# Patient Record
Sex: Female | Born: 1985 | Race: White | Hispanic: No | Marital: Single | Smoking: Never smoker
Health system: Southern US, Community
[De-identification: ages and names within clinical notes are randomized; demographics above are authoritative.]

## PROBLEM LIST (undated history)

## (undated) DIAGNOSIS — N750 Cyst of Bartholin's gland: Secondary | ICD-10-CM

## (undated) HISTORY — PX: OTHER SURGICAL HISTORY: SHX169

---

## 2005-07-31 ENCOUNTER — Ambulatory Visit: Payer: Self-pay | Admitting: Sports Medicine

## 2005-07-31 ENCOUNTER — Inpatient Hospital Stay (HOSPITAL_COMMUNITY): Admission: EM | Admit: 2005-07-31 | Discharge: 2005-08-02 | Payer: Self-pay | Admitting: Emergency Medicine

## 2006-06-19 ENCOUNTER — Other Ambulatory Visit: Admission: RE | Admit: 2006-06-19 | Discharge: 2006-06-19 | Payer: Self-pay | Admitting: Unknown Physician Specialty

## 2006-06-19 ENCOUNTER — Encounter (INDEPENDENT_AMBULATORY_CARE_PROVIDER_SITE_OTHER): Payer: Self-pay | Admitting: Specialist

## 2008-12-16 ENCOUNTER — Emergency Department (HOSPITAL_COMMUNITY): Admission: EM | Admit: 2008-12-16 | Discharge: 2008-12-16 | Payer: Self-pay | Admitting: Emergency Medicine

## 2009-01-05 ENCOUNTER — Other Ambulatory Visit: Admission: RE | Admit: 2009-01-05 | Discharge: 2009-01-05 | Payer: Self-pay | Admitting: Obstetrics & Gynecology

## 2009-02-26 ENCOUNTER — Other Ambulatory Visit: Payer: Self-pay | Admitting: Emergency Medicine

## 2009-02-27 ENCOUNTER — Inpatient Hospital Stay (HOSPITAL_COMMUNITY): Admission: AD | Admit: 2009-02-27 | Discharge: 2009-03-06 | Payer: Self-pay | Admitting: Obstetrics & Gynecology

## 2009-02-27 ENCOUNTER — Ambulatory Visit: Payer: Self-pay | Admitting: Internal Medicine

## 2009-02-27 ENCOUNTER — Ambulatory Visit: Payer: Self-pay | Admitting: Obstetrics & Gynecology

## 2009-03-03 ENCOUNTER — Ambulatory Visit: Payer: Self-pay | Admitting: Emergency Medicine

## 2009-03-04 ENCOUNTER — Encounter: Payer: Self-pay | Admitting: Emergency Medicine

## 2009-03-04 ENCOUNTER — Encounter: Payer: Self-pay | Admitting: Obstetrics & Gynecology

## 2009-06-01 ENCOUNTER — Inpatient Hospital Stay (HOSPITAL_COMMUNITY): Admission: AD | Admit: 2009-06-01 | Discharge: 2009-06-02 | Payer: Self-pay | Admitting: Obstetrics & Gynecology

## 2009-06-01 ENCOUNTER — Ambulatory Visit: Payer: Self-pay | Admitting: Family

## 2009-06-27 ENCOUNTER — Inpatient Hospital Stay (HOSPITAL_COMMUNITY): Admission: AD | Admit: 2009-06-27 | Discharge: 2009-06-29 | Payer: Self-pay | Admitting: Obstetrics & Gynecology

## 2010-11-01 ENCOUNTER — Emergency Department (HOSPITAL_COMMUNITY)
Admission: EM | Admit: 2010-11-01 | Discharge: 2010-11-02 | Disposition: A | Discharge status: Home / Self Care | Payer: Self-pay | Attending: Emergency Medicine | Admitting: Emergency Medicine

## 2010-11-01 DIAGNOSIS — B9689 Other specified bacterial agents as the cause of diseases classified elsewhere: Secondary | ICD-10-CM | POA: Insufficient documentation

## 2010-11-01 DIAGNOSIS — N751 Abscess of Bartholin's gland: Secondary | ICD-10-CM | POA: Insufficient documentation

## 2010-11-01 DIAGNOSIS — A499 Bacterial infection, unspecified: Secondary | ICD-10-CM | POA: Insufficient documentation

## 2010-11-01 DIAGNOSIS — N760 Acute vaginitis: Secondary | ICD-10-CM | POA: Insufficient documentation

## 2010-11-01 LAB — WET PREP, GENITAL

## 2010-11-03 LAB — GC/CHLAMYDIA PROBE AMP, GENITAL: Chlamydia, DNA Probe: NEGATIVE

## 2010-11-05 LAB — CULTURE, ROUTINE-ABSCESS: Culture: NO GROWTH

## 2010-11-23 LAB — CBC
Hemoglobin: 11.2 g/dL — ABNORMAL LOW (ref 12.0–15.0)
MCHC: 33.5 g/dL (ref 30.0–36.0)
MCV: 87.3 fL (ref 78.0–100.0)
Platelets: 172 10*3/uL (ref 150–400)
RDW: 12 % (ref 11.5–15.5)
WBC: 13.5 10*3/uL — ABNORMAL HIGH (ref 4.0–10.5)

## 2010-11-27 LAB — CULTURE, BLOOD (ROUTINE X 2): Culture: NO GROWTH

## 2010-11-27 LAB — COMPREHENSIVE METABOLIC PANEL
ALT: 50 U/L — ABNORMAL HIGH (ref 0–35)
Albumin: 2 g/dL — ABNORMAL LOW (ref 3.5–5.2)
Alkaline Phosphatase: 227 U/L — ABNORMAL HIGH (ref 39–117)
BUN: 2 mg/dL — ABNORMAL LOW (ref 6–23)
BUN: 7 mg/dL (ref 6–23)
Calcium: 7.9 mg/dL — ABNORMAL LOW (ref 8.4–10.5)
GFR calc Af Amer: >60 mL/min (ref >60)
Glucose, Bld: 121 mg/dL — ABNORMAL HIGH (ref 70–99)
Glucose, Bld: 121 mg/dL — ABNORMAL HIGH (ref 70–99)
Potassium: 3 mEq/L — ABNORMAL LOW (ref 3.5–5.1)
Sodium: 133 mEq/L — ABNORMAL LOW (ref 135–145)
Sodium: 135 mEq/L (ref 135–145)
Total Bilirubin: 0.7 mg/dL (ref 0.3–1.2)
Total Protein: 4.7 g/dL — ABNORMAL LOW (ref 6.0–8.3)

## 2010-11-27 LAB — DIFFERENTIAL
Basophils Absolute: 0 10*3/uL (ref 0.0–0.1)
Eosinophils Absolute: 0 10*3/uL (ref 0.0–0.7)
Eosinophils Relative: 0 % (ref 0–5)
Lymphocytes Relative: 2 % — ABNORMAL LOW (ref 12–46)
Lymphocytes Relative: 2 % — ABNORMAL LOW (ref 12–46)
Lymphs Abs: 0.1 10*3/uL — ABNORMAL LOW (ref 0.7–4.0)
Monocytes Relative: 2 % — ABNORMAL LOW (ref 3–12)
Monocytes Relative: 3 % (ref 3–12)
Neutrophils Relative %: 95 % — ABNORMAL HIGH (ref 43–77)

## 2010-11-27 LAB — URINALYSIS, MICROSCOPIC ONLY
Bilirubin Urine: NEGATIVE
Ketones, ur: NEGATIVE mg/dL
Nitrite: NEGATIVE
Nitrite: NEGATIVE
Protein, ur: NEGATIVE mg/dL
Specific Gravity, Urine: 1.025 (ref 1.005–1.030)
Urobilinogen, UA: 0.2 mg/dL (ref 0.0–1.0)
Urobilinogen, UA: 2 mg/dL — ABNORMAL HIGH (ref 0.0–1.0)
pH: 5.5 (ref 5.0–8.0)

## 2010-11-27 LAB — URINE CULTURE: Culture: NO GROWTH

## 2010-11-27 LAB — CBC
HCT: 27.9 % — ABNORMAL LOW (ref 36.0–46.0)
HCT: 31 % — ABNORMAL LOW (ref 36.0–46.0)
HCT: 35.5 % — ABNORMAL LOW (ref 36.0–46.0)
Hemoglobin: 10.7 g/dL — ABNORMAL LOW (ref 12.0–15.0)
Hemoglobin: 9.6 g/dL — ABNORMAL LOW (ref 12.0–15.0)
Hemoglobin: 9.8 g/dL — ABNORMAL LOW (ref 12.0–15.0)
MCHC: 34.7 g/dL (ref 30.0–36.0)
MCHC: 34.8 g/dL (ref 30.0–36.0)
MCV: 93.4 fL (ref 78.0–100.0)
MCV: 93.8 fL (ref 78.0–100.0)
Platelets: 103 10*3/uL — ABNORMAL LOW (ref 150–400)
Platelets: 104 10*3/uL — ABNORMAL LOW (ref 150–400)
Platelets: 106 10*3/uL — ABNORMAL LOW (ref 150–400)
Platelets: 159 10*3/uL (ref 150–400)
Platelets: 176 10*3/uL (ref 150–400)
RBC: 3 MIL/uL — ABNORMAL LOW (ref 3.87–5.11)
RBC: 3.11 MIL/uL — ABNORMAL LOW (ref 3.87–5.11)
RBC: 3.32 MIL/uL — ABNORMAL LOW (ref 3.87–5.11)
RDW: 14.7 % (ref 11.5–15.5)
RDW: 14.8 % (ref 11.5–15.5)
WBC: 22.6 10*3/uL — ABNORMAL HIGH (ref 4.0–10.5)
WBC: 4.1 10*3/uL (ref 4.0–10.5)
WBC: 6.3 10*3/uL (ref 4.0–10.5)
WBC: 6.9 10*3/uL (ref 4.0–10.5)
WBC: 7.6 10*3/uL (ref 4.0–10.5)

## 2010-11-27 LAB — GENTAMICIN LEVEL, PEAK: Gentamicin Pk: 4.4 ug/mL — ABNORMAL LOW (ref 5.0–10.0)

## 2010-11-27 LAB — URINE MICROSCOPIC-ADD ON

## 2010-11-27 LAB — BASIC METABOLIC PANEL
BUN: 8 mg/dL (ref 6–23)
CO2: 18 mEq/L — ABNORMAL LOW (ref 19–32)
Calcium: 7.5 mg/dL — ABNORMAL LOW (ref 8.4–10.5)
Calcium: 8.2 mg/dL — ABNORMAL LOW (ref 8.4–10.5)
Chloride: 111 mEq/L (ref 96–112)
Creatinine, Ser: 0.82 mg/dL (ref 0.4–1.2)
GFR calc Af Amer: >60 mL/min (ref >60)
GFR calc Af Amer: >60 mL/min (ref >60)
GFR calc non Af Amer: >60 mL/min (ref >60)
Sodium: 134 mEq/L — ABNORMAL LOW (ref 135–145)

## 2010-11-27 LAB — URINALYSIS, ROUTINE W REFLEX MICROSCOPIC
Nitrite: POSITIVE — AB
Specific Gravity, Urine: 1.022 (ref 1.005–1.030)
pH: 5.5 (ref 5.0–8.0)

## 2010-11-27 LAB — PROTIME-INR: Prothrombin Time: 13.8 seconds (ref 11.6–15.2)

## 2010-11-27 LAB — BRAIN NATRIURETIC PEPTIDE: Pro B Natriuretic peptide (BNP): 90 pg/mL (ref 0.0–100.0)

## 2010-11-27 LAB — GENTAMICIN LEVEL, TROUGH: Gentamicin Trough: 1.8 ug/mL (ref 0.5–2.0)

## 2010-11-27 LAB — STREP PNEUMONIAE URINARY ANTIGEN: Strep Pneumo Urinary Antigen: NEGATIVE

## 2010-11-27 LAB — LEGIONELLA ANTIGEN, URINE

## 2010-11-27 LAB — APTT: aPTT: 38 seconds — ABNORMAL HIGH (ref 24–37)

## 2010-11-30 LAB — DIFFERENTIAL
Basophils Absolute: 0 10*3/uL (ref 0.0–0.1)
Basophils Relative: 0 % (ref 0–1)
Eosinophils Absolute: 0 10*3/uL (ref 0.0–0.7)
Eosinophils Relative: 0 % (ref 0–5)
Lymphocytes Relative: 1 % — ABNORMAL LOW (ref 12–46)
Monocytes Absolute: 0.1 10*3/uL (ref 0.1–1.0)

## 2010-11-30 LAB — CBC
HCT: 42.5 % (ref 36.0–46.0)
MCV: 88.8 fL (ref 78.0–100.0)
RBC: 4.78 MIL/uL (ref 3.87–5.11)
WBC: 21.1 10*3/uL — ABNORMAL HIGH (ref 4.0–10.5)

## 2010-11-30 LAB — URINALYSIS, ROUTINE W REFLEX MICROSCOPIC
Ketones, ur: 15 mg/dL — AB
Leukocytes, UA: NEGATIVE
Nitrite: NEGATIVE
Protein, ur: 30 mg/dL — AB
Urobilinogen, UA: 0.2 mg/dL (ref 0.0–1.0)
pH: 5.5 (ref 5.0–8.0)

## 2010-11-30 LAB — COMPREHENSIVE METABOLIC PANEL
AST: 24 U/L (ref 0–37)
Albumin: 3.9 g/dL (ref 3.5–5.2)
Alkaline Phosphatase: 56 U/L (ref 39–117)
BUN: 13 mg/dL (ref 6–23)
CO2: 21 mEq/L (ref 19–32)
Chloride: 106 mEq/L (ref 96–112)
Creatinine, Ser: 0.6 mg/dL (ref 0.4–1.2)
GFR calc Af Amer: >60 mL/min (ref >60)
GFR calc non Af Amer: >60 mL/min (ref >60)
Potassium: 3.9 mEq/L (ref 3.5–5.1)
Total Bilirubin: 0.6 mg/dL (ref 0.3–1.2)

## 2010-11-30 LAB — URINE MICROSCOPIC-ADD ON

## 2011-01-03 NOTE — Discharge Summary (Signed)
[name], [name]               ACCOUNT NO.:  [id number]   MEDICAL RECORD NO.:  [id number]          PATIENT TYPE:  INP   LOCATION:  9159                          FACILITY:  [facility]   PHYSICIAN:  Tilda Burrow, M.D. DATE OF BIRTH:  03/15/1986   DATE OF ADMISSION:  02/27/2009  DATE OF DISCHARGE:  03/06/2009                               DISCHARGE SUMMARY   ADMITTING DIAGNOSES:  1. Pregnancy at 23 weeks 3 days.  2. Probable right pyelonephritis.   DISCHARGE DIAGNOSES:  1. Pregnancy at 24 weeks 6 days, not delivered.  2. Right pyelonephritis.  3. Urosepsis, multiple species, specific organism not identified.  4. Acute lung injury secondary to urosepsis.  5. Hypokalemia, corrected.  6. Bilateral pleural effusions, resolved.   DISCHARGE MEDICATIONS:  1. Prenatal vitamins 1 p.o. daily.  2. Iron sulfate 325 mg p.o. twice daily x90 days.  3. Keflex 500 mg p.o. q.i.d. x7 days.  4. Macrodantin 100 mg p.o. at bedtime after 7 days until delivered.   HOSPITAL SUMMARY:  This is a 25 year old female gravida 1, para 0-0-0-0  at 23 weeks 3 days, was admitted on February 27, 2009, after presenting  complaining of right back pain associated with fever, severe  leukocytosis.  White count 22,000, hemoglobin 12, hematocrit 35, and  platelets 159,000.  Urinalysis showing hemoglobin, 4+ ketones, large  leukocytes, large nitrites with many white cells with urine C and S  obtained, which subsequently grew multiple species.  Prenatal course has  been followed through [facility] with initial ultrasound in the  first trimester at 12 weeks 6 days, confirming gestational age.   PRENATAL LABS:  Blood type O positive.  Antibody screen negative.  Hemoglobin 12 and platelets 220,000.  Rubella immune.  Hepatitis B  surface antigen negative.  RPR nonreactive.  HIV nonreactive.  GC  negative.  Chlamydia negative.   HOSPITAL COURSE:  The patient was admitted with pallor with temperature  of 103.4 on the  date of admission, and due to the severity of her  initial presentation and staffing considerations transferred to the  adult ICU.  Initial creatinine clearance was 106.4, estimated.  She was  initially began on Rocephin, but was switched ampicillin 2 g IV q.6 h.,  gentamycin as per pharmacy, clindamycin 900 mg q.8 h. with ibuprofen for  fever.  She was transferred to the AICU, at that time on the evening of  admission.  Blood cultures were obtained which were subsequently  negative.  She had significant pain on first 2 days.  She had persistent  temperature spikes through March 01, 2009.  At this time, she had  significant drop in her white count to a low at 4100 on March 01, 2009.  Hemoglobin dropped to 9.7, hematocrit 27.9, attributed to dilutional  effect as this is more consistent with her preadmission mild anemia.  She developed mild bibasilar rales on hospital day 3, March 01, 2009.  Urine culture returned greater than 10-5 multiple species.  Blood  cultures negative.  Chest x-ray due to development of her cough and  bibasilar rales showed bilateral perihilar  opacities suggest to  represent pulmonary edema or pneumonia.  On March 02, 2009, the patient  had temperature spike to 102.7 and had persistence of low back and right  flank pain.  Renal ultrasound showed no evidence of ureteral  obstruction.  The ICU consultation was performed and the patient had  Zosyn added to antibiotic coverage.  She received Lasix IV for diuresis.   On March 03, 2009, her general appearance was improving with good  diuresis.  Chest x-ray on March 03, 2009 showed diffuse bilateral  interstitial infiltrates.  Laboratory evaluation showed mild hypokalemia  with potassium at 3.0.  She was continued on therapy with a presumed  pyelonephritis.  The bilateral pulmonary infiltrates are considered  consistent with acute lung injury secondary to pyelonephritis.  Trans-  tracheoesophageal studies were performed to rule  out cardiomyopathy.   On March 04, 2009, the patient continued to improve with improved  breathing and reduced pain.  On March 04, 2009, chest x-ray showed  resolved pleural effusion with no pulmonary edema appreciable.  Maternal  fetal consult was obtained.  MRI was obtained to rule out appendicitis  or urinary system abnormality, this was negative.  The cardiac echo was  similarly negative.  On March 04, 2009, she was well enough to be  transferred out of the adult ICU.   On March 05, 2009, she was completely afebrile  with no growth in blood  cultures to date.  She was considered significantly improved.  IV  antibiotics were continued gentamicin and Zosyn.   On March 06, 2009, she was stable for discharge, afebrile x2-1/2 days  with resolution of pain except from mild headache with lungs clear to  auscultation and no CVA tenderness.  There is no uterine activity and  fetal status was excellent.  She will be discharged home on Keflex x7  days and to be followed with Macrodantin 100 mg at bedtime for duration  of pregnancy.  Follow up on March 16, 2009, at [facility].      Tilda Burrow, M.D.  Electronically Signed     JVF/MEDQ  D:  03/06/2009  T:  03/07/2009  Job:  [id number]   cc:   [facility]

## 2011-01-06 NOTE — H&P (Signed)
[name], [name]               ACCOUNT NO.:  [id number]   MEDICAL RECORD NO.:  [id number]          PATIENT TYPE:  INP   LOCATION:  6731                         FACILITY:  [facility]   PHYSICIAN:  Melina Fiddler, MD DATE OF BIRTH:  01/30/1986   DATE OF ADMISSION:  07/31/2005  DATE OF DISCHARGE:                                HISTORY & PHYSICAL   PRIMARY CARE PHYSICIAN:  Unknown.   CHIEF COMPLAINT:  Drug overdose.   HISTORY OF PRESENT ILLNESS:  Patient is a 25 year old Caucasian female who  arrived here after taking a handful of Flexeril tablets.  Per the ED report  she was mad at her boyfriend, but did not really want to hurt herself.  When  I asked her she mentioned being mad at her sister-in-law or someone by the  name of [name].  It sounds like she lives with the person she was upset  with.  She refused to answer any other questions regarding the overdose.  Per the ED notes, she requested that her mom not be contacted, but that her  friend, [name], could be at [phone].  Patient was combative at  one point when the nurse attempted to place a Foley.  Evidently, she had to  be convinced to use the bedside commode.  She denies taking the pills to try  to hurt herself.  She is very difficult to talk to.  She will not answer any  questions unless pressed quite a lot.   REVIEW OF SYSTEMS:  She denies any nausea, vomiting, shortness of breath, or  chest pain.  Will not really answer many other review of systems questions.   PAST MEDICAL HISTORY:  Unknown at the moment since patient is uncooperative.   FAMILY HISTORY:  Patient was uncooperative.   SOCIAL HISTORY:  Patient lives with her roommate, maybe possibly her sister-  in-law with whom she is upset.   PHYSICAL EXAMINATION:  VITAL SIGNS:  Temperature 98.3, heart rate 112-123,  respiratory rate 22, blood pressure 109/92-131/74, 100% on room air.  GENERAL:  Patient is uncooperative, but alert if desires to be so.   She is  in no acute distress.  CARDIOVASCULAR:  Regular rate and rhythm.  No murmurs, rubs, or gallops.  LUNGS:  Clear to auscultation bilaterally.  ABDOMEN:  Positive bowel sounds, soft, nondistended, nontender.  No  hepatosplenomegaly.  EXTREMITIES:  Warm and well perfused.  No clubbing, cyanosis, edema.  NEUROLOGIC:  Patient had 5/5 muscle strength bilaterally.  She was  uncooperative and unable to do full neurologic examination, but appeared to  be alert and oriented at least somewhat to the situation.  She would not  open her eyes during examination.  PSYCH:  Patient was combative when asked questions.  She responded in a loud  voice.  She was frustrated.  She did appear sleepy, but was unable to tell  if she was just trying to avoid questions by closing her eyes.   LABORATORY DATA:  Urine pregnancy tests were negative.  Acetaminophen level  was less than 10 and within normal limits.  Salicylate level was  less than 4  and within normal limits.  Alcohol level was less than 5.  Urine drug screen  was negative.  Tricyclics were positive.  Creatinine 1.  EKG sinus  tachycardia.   ASSESSMENT/PLAN:  Patient is a 25 year old Caucasian female with a Flexeril  overdose.  1.  Overdose.  We will admit for observation.  It appears that the duration      action of this drug is 12-24 hours.  Side effects from overdose include      difficulty breathing, drowsiness, syncope, seizures, hallucinations,      vomiting, and arrhythmias.  We will watch for these and set limits on      heart rate, respiratory rate, and blood pressure for nursing.  If she      does have an arrhythmia may possibly give sodium bicarbonate.  If has      seizure may consider diazepam intravenous bolus.  Patient has      tachycardia which she has currently likely due to overdose.  She will be      put in a telemetry bed and monitored.  Since Flexeril is metabolized in      the liver we will check her LFTs in the morning with  a CMP.  She will      have a sitter at her bedside.  Patient will also get an EKG in the      morning.  2.  Psych issues.  Patient will receive a psych consult tomorrow.  We will      call psych tomorrow.  Will also receive a social work consult.  3.  Fluids, electrolytes, nutrition.  Patient will be n.p.o. while      monitoring for side effects from overdose.  We will place on D5 normal      saline at 125 mL per hour.  4.  Prophylaxis/precautions.  Patient will be placed on fall precautions and      bed rest with a bedside commode.  5.  Disposition.  This is pending psych evaluation and no side effects from      her overdose on Flexeril.  Also, patient needs to be more willing to      cooperate with history to determine if she does have any suicidal      ideations currently before we can discharge her from the hospital.     ______________________________  Alanda Amass    ______________________________  Melina Fiddler, MD    JH/MEDQ  D:  08/01/2005  T:  08/01/2005  Job:  [id number]

## 2011-01-06 NOTE — Discharge Summary (Signed)
[name], [name]               ACCOUNT NO.:  [id number]   MEDICAL RECORD NO.:  [id number]          PATIENT TYPE:  INP   LOCATION:  6731                         FACILITY:  [facility]   PHYSICIAN:  Melina Fiddler, MD DATE OF BIRTH:  [birth year]   DATE OF ADMISSION:  07/31/2005  DATE OF DISCHARGE:  08/02/2005                                 DISCHARGE SUMMARY   PRIMARY CARE PHYSICIAN:  Unknown.   CONSULTING PHYSICIAN:  Antonietta Breach, M.D., psychiatrist.  Social work was  also consulted.   FINAL DIAGNOSIS:  Flexeril overdose.   Please see admission H&P for full history and physical.   PERTINENT LABORATORY DATA:  Discharge labs:  CBC was normal with an  exception of a slightly low hemoglobin of 11.3, hematocrit 33.5.  Chem-7 had  a slightly low potassium of 3.3.  Admission labs showed a similar hemoglobin  and hematocrit of 11.5 and 34.2, otherwise CBC was normal.  She had a  positive tricyclic screen.  Her urine drug screen was negative.  Pregnancy  test was negative.  Alcohol level was within normal limits, less than 5.  Salicylate level was within normal limits, less than 4.0.  Acetaminophen  level was normal.  Creatinine was 1.0 on admission and 0.8 on discharge.   HOSPITAL COURSE:  The patient is a 25 year old otherwise healthy, here for  an overdose of Flexeril.  She initially came to the hospital a little  disoriented and confused and combative, but her condition on discharge was  much improved.  She was alert and oriented, extremely considerate to others  and sorry for her actions, and she told our team as well as the psychiatrist  that she was not thinking suicidal thoughts or contemplating other ways to  kill herself.   Problem 1.  FLEXERIL OVERDOSE:  The patient was admitted for observation  since Flexeril toxicity may cause difficulty breathing, syncope, seizures,  and arrhythmias.  She was placed on telemetry and her vital signs were  monitored q.4h. with set limits  for which nursing would be told to call the  M.D.  During the time she was here, she had no events on telemetry.  Her EKG  was normal.  She had no difficulty breathing, no seizures.  She was alert  and coherent and without SI at time of discharge.  Her LFTs were checked and  found to be normal as Flexeril is metabolized in the liver.  The patient did  have a sitter at her bedside until the morning following her admission.  Alka was placed on fall precautions and given normal saline at 125 mL/hr.  She was n.p.o. while we were monitoring for side effects from her overdose.   Problem 2.  PSYCHIATRIC ISSUES:  The patient was seen by Dr. Jeanie Sewer,  psychiatrist.  Dr. Jeanie Sewer saw no reason for her to stay in the hospital  as long as she was medically cleared.  She was, however, advised to receive  outpatient therapy and was given multiple numbers of different places she  could contact by the Child psychotherapist.  The patient was  told to make an  appointment with one of these places and she promised me that she would.   INSTRUCTIONS TO PATIENT AND FAMILY:  The patient has no dietary or activity  restrictions.  Different places that she was given numbers to call to make  an appointment for therapy were [facility], number [phone]; [facility], [phone]; and the [facility], [phone].  She is to make an appointment in the next one to two weeks.  The patient was  advised to talk to someone or call one of these places if she had any more  thoughts of suicide or depression.   DISCHARGE MEDICATIONS:  None.      Franklyn Lor, MD    ______________________________  Melina Fiddler, MD    TD/MEDQ  D:  08/02/2005  T:  08/03/2005  Job:  [id number]

## 2013-08-18 ENCOUNTER — Encounter: Payer: Self-pay | Admitting: *Deleted

## 2013-08-18 ENCOUNTER — Other Ambulatory Visit: Payer: Self-pay | Admitting: Obstetrics & Gynecology

## 2014-09-16 ENCOUNTER — Ambulatory Visit: Payer: Self-pay | Admitting: Obstetrics and Gynecology

## 2014-09-16 ENCOUNTER — Encounter: Payer: Self-pay | Admitting: Obstetrics and Gynecology

## 2015-08-18 ENCOUNTER — Encounter (HOSPITAL_COMMUNITY): Payer: Self-pay | Admitting: Emergency Medicine

## 2015-08-18 ENCOUNTER — Emergency Department (HOSPITAL_COMMUNITY)
Admission: EM | Admit: 2015-08-18 | Discharge: 2015-08-18 | Disposition: A | Discharge status: Home / Self Care | Payer: Self-pay | Attending: Emergency Medicine | Admitting: Emergency Medicine

## 2015-08-18 DIAGNOSIS — F17200 Nicotine dependence, unspecified, uncomplicated: Secondary | ICD-10-CM | POA: Insufficient documentation

## 2015-08-18 DIAGNOSIS — N751 Abscess of Bartholin's gland: Secondary | ICD-10-CM | POA: Insufficient documentation

## 2015-08-18 MED ORDER — IBUPROFEN 600 MG PO TABS: 600.0000 mg | ORAL_TABLET | Freq: Three times a day (TID) | ORAL | Status: DC | PRN

## 2015-08-18 MED ORDER — DOXYCYCLINE HYCLATE 100 MG PO CAPS: 100.0000 mg | ORAL_CAPSULE | Freq: Two times a day (BID) | ORAL | Status: DC

## 2015-08-18 MED ORDER — DOXYCYCLINE HYCLATE 100 MG PO TABS
100.0000 mg | ORAL_TABLET | Freq: Once | ORAL | Status: AC
  Administered 2015-08-18: 100 mg via ORAL
  Filled 2015-08-18: qty 1

## 2015-08-18 MED ORDER — LIDOCAINE HCL (PF) 1 % IJ SOLN
INTRAMUSCULAR | Status: AC
  Administered 2015-08-18: 5 mL
  Filled 2015-08-18: qty 5

## 2015-08-18 MED ORDER — SULFAMETHOXAZOLE-TRIMETHOPRIM 800-160 MG PO TABS: 1.0000 | ORAL_TABLET | Freq: Two times a day (BID) | ORAL | Status: AC

## 2015-08-18 MED ORDER — LIDOCAINE-EPINEPHRINE (PF) 2 %-1:200000 IJ SOLN
20.0000 mL | Freq: Once | INTRAMUSCULAR | Status: AC
  Administered 2015-08-18: 20 mL
  Filled 2015-08-18: qty 20

## 2015-08-18 MED ORDER — OXYCODONE-ACETAMINOPHEN 5-325 MG PO TABS
1.0000 | ORAL_TABLET | Freq: Once | ORAL | Status: AC
Start: 2015-08-18 — End: 2015-08-18
  Administered 2015-08-18: 1 via ORAL
  Filled 2015-08-18: qty 1

## 2015-08-18 MED ORDER — CEFTRIAXONE SODIUM 250 MG IJ SOLR
250.0000 mg | Freq: Once | INTRAMUSCULAR | Status: AC
  Administered 2015-08-18: 250 mg via INTRAMUSCULAR
  Filled 2015-08-18: qty 250

## 2015-08-18 NOTE — ED Notes (Signed)
Pt stable, ambulatory, states understanding of discharge instructions 

## 2015-08-18 NOTE — ED Notes (Addendum)
Strike previous note.  Discharge papers for wrong patient

## 2015-08-18 NOTE — ED Notes (Signed)
Discharge paperwork written on wrong patient.  Pt not to be discharged at this time, Dr. Patria Maneampos to see.

## 2015-08-18 NOTE — ED Notes (Signed)
Pt. reports abscess at right groin onset today with no drainage .

## 2015-08-18 NOTE — ED Provider Notes (Signed)
CSN: [id number]     Arrival date & time 08/18/15  0114 History   First MD Initiated Contact with Patient 08/18/15 0403     Chief Complaint  Patient presents with  . Abscess      HPI Patient presents to the emergency department complaining of swelling and abscess to her right labia.  She has a history of Bartholin's gland abscess.  She reports normally drains on its own.  This time it is increased in size and is causing worsening pain including pain when she sits.  No fevers or chills.  No spreading redness.  No other complaints.   History reviewed. No pertinent past medical history. History reviewed. No pertinent past surgical history. No family history on file. Social History  Substance Use Topics  . Smoking status: Current Some Day Smoker  . Smokeless tobacco: None  . Alcohol Use: Yes   OB History    No data available     Review of Systems  All other systems reviewed and are negative.     Allergies  Morphine and related  Home Medications   Prior to Admission medications   Medication Sig Start Date End Date Taking? Authorizing Provider  doxycycline (VIBRAMYCIN) 100 MG capsule Take 1 capsule (100 mg total) by mouth 2 (two) times daily. 08/18/15   Azalia BilisKevin Amorie Rentz, MD  ibuprofen (ADVIL,MOTRIN) 600 MG tablet Take 1 tablet (600 mg total) by mouth every 8 (eight) hours as needed. 08/18/15   Azalia BilisKevin Kanon Colunga, MD  sulfamethoxazole-trimethoprim (BACTRIM DS,SEPTRA DS) 800-160 MG tablet Take 1 tablet by mouth 2 (two) times daily. 08/18/15 08/25/15  Azalia BilisKevin Joshoa Shawler, MD   BP 138/91 mmHg  Pulse 86  Temp(Src) 98.8 F (37.1 C) (Oral)  Resp 17  Ht 5\' 7"  (1.702 m)  Wt 131 lb (59.421 kg)  BMI 20.51 kg/m2  SpO2 94%  LMP 08/15/2015 Physical Exam  Constitutional: She is oriented to person, place, and time. She appears well-developed and well-nourished.  HENT:  Head: Normocephalic.  Eyes: EOM are normal.  Neck: Normal range of motion.  Pulmonary/Chest: Effort normal.  Abdominal: She  exhibits no distension.  Genitourinary:  Large abscess of her right Bartholin's gland.  No spreading erythema. no drainage.  Musculoskeletal: Normal range of motion.  Neurological: She is alert and oriented to person, place, and time.  Psychiatric: She has a normal mood and affect.  Nursing note and vitals reviewed.   ED Course  Procedures (including critical care time)  INCISION AND DRAINAGE Performed by: Lyanne CoAMPOS,Ruthe Roemer M Consent: Verbal consent obtained. Risks and benefits: risks, benefits and alternatives were discussed Time out performed prior to procedure Type: abscess Body area: Right labia Anesthesia: local infiltration Incision was made with a scalpel. Local anesthetic: lidocaine 2 % with epinephrine Anesthetic total: 7 ml Complexity: complex Blunt dissection to break up loculations Drainage: purulent Drainage amount: Large  Packing material: None  Patient tolerance: Patient tolerated the procedure well with no immediate complications.     Labs Review Labs Reviewed - No data to display  Imaging Review No results found. I have personally reviewed and evaluated these images and lab results as part of my medical decision-making.   EKG Interpretation None      MDM   Final diagnoses:  Bartholin's gland abscess    Patient tolerated the procedure well.  She'll be treated with antibiotics.  Gynecology follow-up.  She understands return to the ER for new or worsening symptoms    Azalia BilisKevin Daquana Paddock, MD 08/18/15 [phone]

## 2015-08-18 NOTE — Discharge Instructions (Signed)
Bartholin Cyst or Abscess A Bartholin cyst is a fluid-filled sac that forms on a Bartholin gland. Bartholin glands are small glands that are located within the folds of skin (labia) along the sides of the lower opening of the vagina. These glands produce a fluid to moisten the outside of the vagina during sexual intercourse. A Bartholin cyst causes a bulge on the side of the vagina. A cyst that is not large or infected may not cause symptoms or problems. However, if the fluid within the cyst becomes infected, the cyst can turn into an abscess. An abscess may cause discomfort or pain. CAUSES A Bartholin cyst may develop when the duct of the gland becomes blocked. In many cases, the cause of this is not known. Various kinds of bacteria can cause the cyst to become infected and develop into an abscess. RISK FACTORS You may be at an increased risk of developing a Bartholin cyst or abscess if:  You are a woman of reproductive age.  You have a history of previous Bartholin cysts or abscesses.  You have diabetes.  You have a sexually transmitted disease (STD). SIGNS AND SYMPTOMS The severity of symptoms varies depending on the size of the cyst and whether it is infected. Symptoms may include:  A bulge or swelling near the lower opening of your vagina.  Discomfort or pain.  Redness.  Pain during sexual intercourse.  Pain when walking.  Fluid draining from the area. DIAGNOSIS Your health care provider may make a diagnosis based on your symptoms and a physical exam. He or she will look for swelling in your vaginal area. Blood tests may be done to check for infections. A sample of fluid from the cyst or abscess may also be taken to be tested in a lab. TREATMENT Small cysts that are not infected may not require any treatment. These often go away on their own. Yourhealth care provider will recommend hot baths and the use of warm compresses. These may also be part of the treatment for an abscess.  Treatment options for a large cyst or abscess may include:   Antibiotic medicine.  A surgical procedure to drain the abscess. One of the following procedures may be done:  Incision and drainage. An incision is made in the cyst or abscess so that the fluid drains out. A catheter may be placed inside the cyst so that it does not close and fill up with fluid again. The catheter will be removed after you have a follow-up visit with a specialist (gynecologist).  Marsupialization. The cyst or abscess is opened and kept open by stitching the edges of the skin to the walls of the cyst or abscess. This allows it to continue to drain and not fill up with fluid again. If you have cysts or abscesses that keep returning and have required incision and drainage multiple times, your health care provider may talk to you about surgery to remove the Bartholin gland. HOME CARE INSTRUCTIONS  Take medicines only as directed by your health care provider.  If you were prescribed an antibiotic medicine, finish it all even if you start to feel better.  Apply warm, wet compresses to the area or take warm, shallow baths that cover your pelvic region (sitz baths) several times a day or as directed by your health care provider.  Do not squeeze the cyst or apply heavy pressure to it.  Do not have sexual intercourse until the cyst has gone away.  If your cyst or abscess was   opened, a small piece of gauze or a drain may have been placed in the area to allow drainage. Do not remove the gauze or the drain until directed by your health care provider.  Wear feminine pads--not tampons--as needed for any drainage or bleeding.  Keep all follow-up visits as directed by your health care provider. This is important. PREVENTION Take these steps to help prevent a Bartholin cyst from returning:  Practice good hygiene.   Clean your vaginal area with mild soap and a soft cloth when you bathe.  Practice safe sex to prevent  STDs. SEEK MEDICAL CARE IF:  You have increased pain, swelling, or redness in the area of the cyst.  Puslike drainage is coming from the cyst.  You have a fever.   This information is not intended to replace advice given to you by your health care provider. Make sure you discuss any questions you have with your health care provider.   Document Released: 08/07/2005 Document Revised: 08/28/2014 Document Reviewed: 03/23/2014 Elsevier Interactive Patient Education 2016 [employer]  

## 2016-01-03 ENCOUNTER — Emergency Department (HOSPITAL_COMMUNITY)
Admission: EM | Admit: 2016-01-03 | Discharge: 2016-01-04 | Disposition: A | Discharge status: Home / Self Care | Payer: Self-pay | Attending: Emergency Medicine | Admitting: Emergency Medicine

## 2016-01-03 ENCOUNTER — Encounter (HOSPITAL_COMMUNITY): Payer: Self-pay | Admitting: Emergency Medicine

## 2016-01-03 DIAGNOSIS — R5383 Other fatigue: Secondary | ICD-10-CM | POA: Insufficient documentation

## 2016-01-03 DIAGNOSIS — N898 Other specified noninflammatory disorders of vagina: Secondary | ICD-10-CM

## 2016-01-03 DIAGNOSIS — F17200 Nicotine dependence, unspecified, uncomplicated: Secondary | ICD-10-CM | POA: Insufficient documentation

## 2016-01-03 DIAGNOSIS — Z3202 Encounter for pregnancy test, result negative: Secondary | ICD-10-CM | POA: Insufficient documentation

## 2016-01-03 DIAGNOSIS — R42 Dizziness and giddiness: Secondary | ICD-10-CM | POA: Insufficient documentation

## 2016-01-03 DIAGNOSIS — Z792 Long term (current) use of antibiotics: Secondary | ICD-10-CM | POA: Insufficient documentation

## 2016-01-03 DIAGNOSIS — N750 Cyst of Bartholin's gland: Secondary | ICD-10-CM | POA: Insufficient documentation

## 2016-01-03 HISTORY — DX: Cyst of Bartholin's gland: N75.0

## 2016-01-03 LAB — CBC WITH DIFFERENTIAL/PLATELET
BASOS ABS: 0 10*3/uL (ref 0.0–0.1)
BASOS PCT: 0 %
Eosinophils Absolute: 0.1 10*3/uL (ref 0.0–0.7)
Eosinophils Relative: 1 %
HEMATOCRIT: 39.8 % (ref 36.0–46.0)
Hemoglobin: 13 g/dL (ref 12.0–15.0)
LYMPHS PCT: 16 %
Lymphs Abs: 1.7 10*3/uL (ref 0.7–4.0)
MCH: 30.9 pg (ref 26.0–34.0)
MCHC: 32.7 g/dL (ref 30.0–36.0)
MCV: 94.5 fL (ref 78.0–100.0)
MONO ABS: 0.7 10*3/uL (ref 0.1–1.0)
Monocytes Relative: 6 %
NEUTROS ABS: 7.8 10*3/uL — AB (ref 1.7–7.7)
Neutrophils Relative %: 77 %
PLATELETS: 235 10*3/uL (ref 150–400)
RBC: 4.21 MIL/uL (ref 3.87–5.11)
RDW: 13.1 % (ref 11.5–15.5)
WBC: 10.3 10*3/uL (ref 4.0–10.5)

## 2016-01-03 LAB — BASIC METABOLIC PANEL
ANION GAP: 6 (ref 5–15)
BUN: 11 mg/dL (ref 6–20)
CALCIUM: 9.1 mg/dL (ref 8.9–10.3)
CO2: 25 mmol/L (ref 22–32)
Chloride: 109 mmol/L (ref 101–111)
Creatinine, Ser: 0.76 mg/dL (ref 0.44–1.00)
Glucose, Bld: 107 mg/dL — ABNORMAL HIGH (ref 65–99)
POTASSIUM: 3.8 mmol/L (ref 3.5–5.1)
Sodium: 140 mmol/L (ref 135–145)

## 2016-01-03 LAB — URINALYSIS, ROUTINE W REFLEX MICROSCOPIC
BILIRUBIN URINE: NEGATIVE
GLUCOSE, UA: NEGATIVE mg/dL
HGB URINE DIPSTICK: NEGATIVE
Ketones, ur: NEGATIVE mg/dL
Leukocytes, UA: NEGATIVE
NITRITE: NEGATIVE
PH: 6.5 (ref 5.0–8.0)
Protein, ur: NEGATIVE mg/dL
SPECIFIC GRAVITY, URINE: 1.021 (ref 1.005–1.030)

## 2016-01-03 LAB — POC URINE PREG, ED: PREG TEST UR: NEGATIVE

## 2016-01-03 NOTE — ED Notes (Signed)
Pt. reports right vaginal pain onset 4 days ago , pt. suspects infected Bartholin cyst with swelling/drainage  , denies fever or chills.

## 2016-01-04 LAB — GC/CHLAMYDIA PROBE AMP (~~LOC~~) NOT AT ARMC
Chlamydia: NEGATIVE
NEISSERIA GONORRHEA: NEGATIVE

## 2016-01-04 LAB — WET PREP, GENITAL
Clue Cells Wet Prep HPF POC: NONE SEEN
Sperm: NONE SEEN
TRICH WET PREP: NONE SEEN
WBC, Wet Prep HPF POC: NONE SEEN
YEAST WET PREP: NONE SEEN

## 2016-01-04 MED ORDER — CEPHALEXIN 500 MG PO CAPS: 500.0000 mg | ORAL_CAPSULE | Freq: Two times a day (BID) | ORAL | Status: DC

## 2016-01-04 MED ORDER — LIDOCAINE-EPINEPHRINE 1 %-1:100000 IJ SOLN
10.0000 mL | Freq: Once | INTRAMUSCULAR | Status: AC
  Administered 2016-01-04: 10 mL via INTRADERMAL
  Filled 2016-01-04: qty 1

## 2016-01-04 MED ORDER — AZITHROMYCIN 250 MG PO TABS
1000.0000 mg | ORAL_TABLET | Freq: Once | ORAL | Status: AC
  Administered 2016-01-04: 1000 mg via ORAL
  Filled 2016-01-04: qty 4

## 2016-01-04 MED ORDER — CEFTRIAXONE SODIUM 250 MG IJ SOLR
250.0000 mg | Freq: Once | INTRAMUSCULAR | Status: AC
  Administered 2016-01-04: 250 mg via INTRAMUSCULAR
  Filled 2016-01-04: qty 250

## 2016-01-04 NOTE — ED Provider Notes (Signed)
CSN: [id number]     Arrival date & time 01/03/16  2029 History  By signing my name below, I, Bethel Born, attest that this documentation has been prepared under the direction and in the presence of Tomasita Crumble, MD. Electronically Signed: Bethel Born, ED Scribe. 01/04/2016. 12:24 AM   Chief Complaint  Patient presents with  . Vaginal Pain   The history is provided by the patient. No language interpreter was used.   [name] is a 30 y.o. female with PMHx of Bartholin's cyst who presents to the Emergency Department complaining of constant, 8/10 in severity, pressure-like, right sided vaginal pain with onset 4 days ago. She has had similar pain in the related to a Bartholin's cysts requiring 3 I&Ds in the past. She sat in a sitz bath 2 nights ago and the area opened and drained which provided some relief in pain, however the pain worsened again today.  Associated symptoms include fatigue, lightheadedness, and abdominal pain. Pt is concerned that her IUD is causing infection because it has been in place for 7 years. She does not have a local OB/GYN and is requesting a referral. Pt denies fever and chills.   Past Medical History  Diagnosis Date  . Bartholin's cyst    History reviewed. No pertinent past surgical history. No family history on file. Social History  Substance Use Topics  . Smoking status: Current Some Day Smoker  . Smokeless tobacco: None  . Alcohol Use: Yes   OB History    No data available     Review of Systems  Constitutional: Positive for fatigue. Negative for fever and chills.  Gastrointestinal: Positive for abdominal pain.  Genitourinary: Positive for vaginal pain.  Neurological: Positive for light-headedness.  All other systems reviewed and are negative.  Allergies  Morphine and related  Home Medications   Prior to Admission medications   Medication Sig Start Date End Date Taking? Authorizing Provider  doxycycline (VIBRAMYCIN) 100 MG capsule Take  1 capsule (100 mg total) by mouth 2 (two) times daily. 08/18/15   Azalia Bilis, MD  ibuprofen (ADVIL,MOTRIN) 600 MG tablet Take 1 tablet (600 mg total) by mouth every 8 (eight) hours as needed. 08/18/15   Azalia Bilis, MD   BP 131/80 mmHg  Pulse 58  Temp(Src) 98.7 F (37.1 C) (Oral)  Resp 12  Ht  (1.702 m)  Wt 130 lb 1 oz (58.996 kg)  BMI 20.37 kg/m2  SpO2 99%  LMP 12/24/2015 (Approximate) Physical Exam  Constitutional: She is oriented to person, place, and time. She appears well-developed and well-nourished. No distress.  HENT:  Head: Normocephalic and atraumatic.  Nose: Nose normal.  Mouth/Throat: Oropharynx is clear and moist. No oropharyngeal exudate.  Eyes: Conjunctivae and EOM are normal. Pupils are equal, round, and reactive to light. No scleral icterus.  Neck: Normal range of motion. Neck supple. No JVD present. No tracheal deviation present. No thyromegaly present.  Cardiovascular: Normal rate, regular rhythm and normal heart sounds.  Exam reveals no gallop and no friction rub.   No murmur heard. Pulmonary/Chest: Effort normal and breath sounds normal. No respiratory distress. She has no wheezes. She exhibits no tenderness.  Abdominal: Soft. Bowel sounds are normal. She exhibits no distension and no mass. There is no tenderness. There is no rebound and no guarding.  Genitourinary:  Large, 5 cm Bartholin's cyst on right labia, fluctuant, and TTP Female chaperone present   Musculoskeletal: Normal range of motion. She exhibits no edema or tenderness.  Lymphadenopathy:  She has no cervical adenopathy.  Neurological: She is alert and oriented to person, place, and time. No cranial nerve deficit. She exhibits normal muscle tone.  Skin: Skin is warm and dry. No rash noted. No erythema. No pallor.  Nursing note and vitals reviewed.   ED Course  Procedures (including critical care time)  INCISION AND DRAINAGE PROCEDURE NOTE: Patient identification was confirmed and  verbal consent was obtained. This procedure was performed by Tomasita CrumbleAdeleke Bowden Boody, MD at 12:20 AM. Site: right labia Sterile procedures observed Needle size: 21 guage Anesthetic used (type and amt): 5 cc 1% lidocaine with epinephrine  Blade size: 11 Drainage: Approximately 15 cc, purulent, maloderous Complexity: complex Site anesthetized, incision made over site, wound drained and explored loculations, rinsed with copious amounts of normal saline, covered with dry, sterile dressing.  Pt tolerated procedure well without complications.  Instructions for care discussed verbally and pt provided with additional written instructions for homecare and f/u.   DIAGNOSTIC STUDIES: Oxygen Saturation is 99% on RA,  normal by my interpretation.    COORDINATION OF CARE: 12:09 AM Discussed treatment plan which includes lab work and I&D with pt at bedside and pt agreed to plan.  Labs Review Labs Reviewed  CBC WITH DIFFERENTIAL/PLATELET - Abnormal; Notable for the following:    Neutro Abs 7.8 (*)    All other components within normal limits  BASIC METABOLIC PANEL - Abnormal; Notable for the following:    Glucose, Bld 107 (*)    All other components within normal limits  URINALYSIS, ROUTINE W REFLEX MICROSCOPIC (NOT AT [facility]) - Abnormal; Notable for the following:    APPearance CLOUDY (*)    All other components within normal limits  WET PREP, GENITAL  POC URINE PREG, ED  GC/CHLAMYDIA PROBE AMP ([place]) NOT AT [facility]    Imaging Review No results found. I have personally reviewed and evaluated these lab results as part of my medical decision-making.   EKG Interpretation None      MDM   Final diagnoses:  Bartholin cyst  Vaginal discharge   Patient presents to the ED for abscess in her vagina.  Physical exam is consistent with bartholin cyst.  I&D performed.  She was given ceftriax and azithro for possible for STD.  Will keep patient on keflex for abscess and refer to OB.  She appears well  and in NAD.  VS remain within her normal limits and she is safe for DC.    I personally performed the services described in this documentation, which was scribed in my presence. The recorded information has been reviewed and is accurate.      Tomasita CrumbleAdeleke Yamir Carignan, MD 01/04/16 [phone]

## 2016-01-04 NOTE — Discharge Instructions (Signed)
Bartholin Cyst or Abscess Ms. [name], see an OB/GYN physician within 3 days for close follow up.  Take antibiotics as directed for treatment.  If symptoms worsen, come back to the ED immediately.  Thank you. A Bartholin cyst is a fluid-filled sac that forms on a Bartholin gland. Bartholin glands are small glands that are found in the folds of skin (labia) on the sides of the lower opening of the vagina. This type of cyst causes a bulge on the side of the vagina. A cyst that is not large or infected may not cause problems. However, if the fluid in the cyst becomes infected, the cyst can turn into an abscess. An abscess may cause discomfort or pain. HOME CARE  Take medicines only as told by your doctor.  If you were prescribed an antibiotic medicine, finish all of it even if you start to feel better.  Apply warm, wet compresses to the area or take warm, shallow baths that cover your pelvic area (sitz baths). Do this many times each day or as told by your doctor.  Do not squeeze the cyst. Do not apply heavy pressure to it.  Do not have sex until the cyst has gone away.  If your cyst or abscess was opened by your doctor, a small piece of gauze or a drain may have been placed in the area. That lets the cyst drain. Do not remove the gauze or the drain until your doctor tells you it is okay to do that.  Do not wear tampons. Wear feminine pads as needed for any fluid or blood.  Keep all follow-up visits as told by your doctor. This is important. GET HELP IF:  Your pain, puffiness (swelling), or redness in the area of the cyst gets worse.  You have fluid or pus pus coming from the cyst.  You have a fever.   This information is not intended to replace advice given to you by your health care provider. Make sure you discuss any questions you have with your health care provider.   Document Released: 11/03/2008 Document Revised: 12/22/2014 Document Reviewed: 03/23/2014 Elsevier Interactive Patient  Education [employer]2016 Elsevier Inc.

## 2016-01-29 ENCOUNTER — Encounter (HOSPITAL_COMMUNITY): Payer: Self-pay | Admitting: *Deleted

## 2016-01-29 ENCOUNTER — Emergency Department (HOSPITAL_COMMUNITY)
Admission: EM | Admit: 2016-01-29 | Discharge: 2016-01-29 | Disposition: A | Discharge status: Home / Self Care | Payer: Self-pay | Attending: Emergency Medicine | Admitting: Emergency Medicine

## 2016-01-29 DIAGNOSIS — N751 Abscess of Bartholin's gland: Secondary | ICD-10-CM | POA: Insufficient documentation

## 2016-01-29 DIAGNOSIS — F17200 Nicotine dependence, unspecified, uncomplicated: Secondary | ICD-10-CM | POA: Insufficient documentation

## 2016-01-29 MED ORDER — OXYCODONE-ACETAMINOPHEN 5-325 MG PO TABS
1.0000 | ORAL_TABLET | Freq: Once | ORAL | Status: AC
  Administered 2016-01-29: 1 via ORAL
  Filled 2016-01-29: qty 1

## 2016-01-29 MED ORDER — HYDROCODONE-ACETAMINOPHEN 5-325 MG PO TABS: 1.0000 | ORAL_TABLET | Freq: Four times a day (QID) | ORAL | Status: DC | PRN

## 2016-01-29 MED ORDER — LIDOCAINE HCL 2 % EX GEL
1.0000 "application " | Freq: Once | CUTANEOUS | Status: AC
  Administered 2016-01-29: 1 via TOPICAL
  Filled 2016-01-29: qty 20

## 2016-01-29 MED ORDER — CEPHALEXIN 500 MG PO CAPS: 500.0000 mg | ORAL_CAPSULE | Freq: Three times a day (TID) | ORAL | Status: DC

## 2016-01-29 MED ORDER — LIDOCAINE HCL (PF) 1 % IJ SOLN
5.0000 mL | Freq: Once | INTRAMUSCULAR | Status: AC
  Administered 2016-01-29: 5 mL
  Filled 2016-01-29: qty 5

## 2016-01-29 MED ORDER — SULFAMETHOXAZOLE-TRIMETHOPRIM 800-160 MG PO TABS: 1.0000 | ORAL_TABLET | Freq: Two times a day (BID) | ORAL | Status: AC

## 2016-01-29 NOTE — Discharge Instructions (Signed)
The [facility] will call you to schedule a follow up appointment. If they have not contacted you by Tuesday you can call them.   Bartholin Cyst or Abscess A Bartholin cyst is a fluid-filled sac that forms on a Bartholin gland. Bartholin glands are small glands that are located within the folds of skin (labia) along the sides of the lower opening of the vagina. These glands produce a fluid to moisten the outside of the vagina during sexual intercourse. A Bartholin cyst causes a bulge on the side of the vagina. A cyst that is not large or infected may not cause symptoms or problems. However, if the fluid within the cyst becomes infected, the cyst can turn into an abscess. An abscess may cause discomfort or pain. CAUSES A Bartholin cyst may develop when the duct of the gland becomes blocked. In many cases, the cause of this is not known. Various kinds of bacteria can cause the cyst to become infected and develop into an abscess. RISK FACTORS You may be at an increased risk of developing a Bartholin cyst or abscess if:  You are a woman of reproductive age.  You have a history of previous Bartholin cysts or abscesses.  You have diabetes.  You have a sexually transmitted disease (STD). SIGNS AND SYMPTOMS The severity of symptoms varies depending on the size of the cyst and whether it is infected. Symptoms may include:  A bulge or swelling near the lower opening of your vagina.  Discomfort or pain.  Redness.  Pain during sexual intercourse.  Pain when walking.  Fluid draining from the area. DIAGNOSIS Your health care provider may make a diagnosis based on your symptoms and a physical exam. He or she will look for swelling in your vaginal area. Blood tests may be done to check for infections. A sample of fluid from the cyst or abscess may also be taken to be tested in a lab. TREATMENT Small cysts that are not infected may not require any treatment. These often go away on their own.  Yourhealth care provider will recommend hot baths and the use of warm compresses. These may also be part of the treatment for an abscess. Treatment options for a large cyst or abscess may include:   Antibiotic medicine.  A surgical procedure to drain the abscess. One of the following procedures may be done:  Incision and drainage. An incision is made in the cyst or abscess so that the fluid drains out. A catheter may be placed inside the cyst so that it does not close and fill up with fluid again. The catheter will be removed after you have a follow-up visit with a specialist (gynecologist).  Marsupialization. The cyst or abscess is opened and kept open by stitching the edges of the skin to the walls of the cyst or abscess. This allows it to continue to drain and not fill up with fluid again. If you have cysts or abscesses that keep returning and have required incision and drainage multiple times, your health care provider may talk to you about surgery to remove the Bartholin gland. HOME CARE INSTRUCTIONS  Take medicines only as directed by your health care provider.  If you were prescribed an antibiotic medicine, finish it all even if you start to feel better.  Apply warm, wet compresses to the area or take warm, shallow baths that cover your pelvic region (sitz baths) several times a day or as directed by your health care provider.  Do not squeeze the  cyst or apply heavy pressure to it.  Do not have sexual intercourse until the cyst has gone away.  If your cyst or abscess was opened, a small piece of gauze or a drain may have been placed in the area to allow drainage. Do not remove the gauze or the drain until directed by your health care provider.  Wear feminine pads--not tampons--as needed for any drainage or bleeding.  Keep all follow-up visits as directed by your health care provider. This is important. PREVENTION Take these steps to help prevent a Bartholin cyst from  returning:  Practice good hygiene.   Clean your vaginal area with mild soap and a soft cloth when you bathe.  Practice safe sex to prevent STDs. SEEK MEDICAL CARE IF:  You have increased pain, swelling, or redness in the area of the cyst.  Puslike drainage is coming from the cyst.  You have a fever.   This information is not intended to replace advice given to you by your health care provider. Make sure you discuss any questions you have with your health care provider.   Document Released: 08/07/2005 Document Revised: 08/28/2014 Document Reviewed: 03/23/2014 Elsevier Interactive Patient Education [employer]

## 2016-01-29 NOTE — ED Provider Notes (Signed)
CSN: [id number]     Arrival date & time 01/29/16  1321 History   First MD Initiated Contact with Patient 01/29/16 1337     Chief Complaint  Patient presents with  . Abscess     (Consider location/radiation/quality/duration/timing/severity/associated sxs/prior Treatment) Patient is a 30 y.o. female presenting with abscess. The history is provided by the patient. No language interpreter was used.  Abscess Location:  Ano-genital Ano-genital abscess location:  Vagina Size:  6 cm Abscess quality: fluctuance, painful and warmth   Red streaking: no   Duration:  1 week Progression:  Worsening Pain details:    Quality:  Burning, pressure and sharp   Timing:  Constant   Progression:  Worsening Chronicity:  Recurrent Relieved by:  Nothing Worsened by:  Nothing tried Ineffective treatments:  None tried Risk factors: prior abscess    [name] is a 30 y.o. female who presents to the ED with pain and swelling to the right labia that started about a week ago. She states that she has had 2 previous visits for I&D of Bartholin's abscess in the same area. She has an IUD for birth control; however, it was due to be removed 3 years ago and she has not been for removal.   Past Medical History  Diagnosis Date  . Bartholin's cyst    History reviewed. No pertinent past surgical history. History reviewed. No pertinent family history. Social History  Substance Use Topics  . Smoking status: Current Some Day Smoker  . Smokeless tobacco: None  . Alcohol Use: Yes   OB History    No data available     Review of Systems Negative except as stated in HPI   Allergies  Morphine and related  Home Medications   Prior to Admission medications   Medication Sig Start Date End Date Taking? Authorizing Provider  cephALEXin (KEFLEX) 500 MG capsule Take 1 capsule (500 mg total) by mouth 3 (three) times daily. 01/29/16   Hope Orlene OchM Neese, NP  doxycycline (VIBRAMYCIN) 100 MG capsule Take 1 capsule (100 mg  total) by mouth 2 (two) times daily. 08/18/15   Azalia BilisKevin Campos, MD  HYDROcodone-acetaminophen (NORCO) 5-325 MG tablet Take 1 tablet by mouth every 6 (six) hours as needed. 01/29/16   Hope Orlene OchM Neese, NP  ibuprofen (ADVIL,MOTRIN) 600 MG tablet Take 1 tablet (600 mg total) by mouth every 8 (eight) hours as needed. 08/18/15   Azalia BilisKevin Campos, MD  levonorgestrel (MIRENA) 20 MCG/24HR IUD 1 each by Intrauterine route once.    Historical Provider, MD  sulfamethoxazole-trimethoprim (BACTRIM DS,SEPTRA DS) 800-160 MG tablet Take 1 tablet by mouth 2 (two) times daily. 01/29/16 02/05/16  Hope Orlene OchM Neese, NP   BP 116/78 mmHg  Pulse 90  Temp(Src) 98 F (36.7 C) (Oral)  Resp 18  SpO2 98%  LMP 01/29/2016 Physical Exam  Constitutional: She is oriented to person, place, and time. She appears well-developed and well-nourished.  HENT:  Head: Normocephalic.  Eyes: EOM are normal.  Neck: Neck supple.  Cardiovascular: Normal rate.   Pulmonary/Chest: Effort normal.  Genitourinary: There is tenderness on the right labia.  6 cm abscess with tenderness right labia and Bartholin gland area.  Musculoskeletal: Normal range of motion.  Neurological: She is alert and oriented to person, place, and time. No cranial nerve deficit.  Skin: Skin is warm and dry.  Patient has bruising that she states occurred earlier in the [place] while [place] the beach.  Psychiatric: She has a normal mood and affect. Her behavior is normal.  Nursing note and vitals reviewed.   ED Course  Procedures  Bartholin Cyst I&D and Word Catheter Placement Enlarged abscess palpated in front of the hymenal ring around 5 or 7 o' clock.  Written informed consent was obtained.  Discussed complications and possible outcomes of procedure including recurrence of cyst, scarring leading to infecton, bleeding, dyspareunia, distortion of anatomy.  Patient was examined in the dorsal lithotomy position and mass was identified.  The area was prepped with topical Iidocaine and  draped in a sterile manner. Additional lidocaine 1% (3 ml) was then used to infiltrate area on top of the cyst.  A 7 mm incision was made using a sterile #11 scapel. Upon palpation of the mass, approximately 100ccs of bloody purulent drainage was expressed through the incision. A hemostat was used to break up loculations, which resulted in expression of more bloody purulent drainage. The open cyst was then copiously irrigated with normal saline, and a Word catheter was placed. 2 ml of sterile water was used to inflate the catheter balloon.  The end of the catheter was tucked into the vagina.  Patient tolerated the procedure well, reported feeling " a lot better." - Bactrim DS bid x 7 days and Keflex 500 mg for treatment - Recommended Sitz baths bid and Motrin and Hyddrocodone was given  prn pain.   She was told to call to be examined if she experiences increasing swelling, pain, vaginal discharge, or fever.  - She was instructed to wear a peripad to absorb discharge, and to maintain pelvic rest while the Word catheter is in place.  -The catheter will be left in place for at least two to four weeks to promote formation of an epithelialized tract for permanent drainage of glandular secretions.  - She will [facility] weeks from now for removal of word catheter. At that time she also request removal of IUD.  Contacted the [facility] GYN clinic via [place] and they will call patient to schedule an appointment.     MDM  30 y.o. female with recurrent Bartholin abscess on the right stable for d/c without fever and does not appear toxic. Discussed with the patient plan of care and all questioned fully answered. She will [facility] at the [facility] or go to [facility] earlier if any problems arise.   Final diagnoses:  Bartholin's gland abscess      Janne Napoleon, NP 01/29/16 1511  Lyndal Pulley, MD 01/29/16 Ernestina Columbia

## 2016-01-29 NOTE — ED Notes (Signed)
Bartholin cyst drained and Ward catheter inserted per NP. Pt tolerated well.

## 2016-01-29 NOTE — ED Notes (Signed)
Pt reports abscess or cyst to vaginal area that is causing severe pain.

## 2016-02-02 ENCOUNTER — Encounter: Payer: Self-pay | Admitting: Obstetrics and Gynecology

## 2016-02-04 ENCOUNTER — Emergency Department (HOSPITAL_COMMUNITY)
Admission: EM | Admit: 2016-02-04 | Discharge: 2016-02-04 | Disposition: A | Discharge status: Home / Self Care | Payer: Self-pay | Attending: Emergency Medicine | Admitting: Emergency Medicine

## 2016-02-04 ENCOUNTER — Encounter (HOSPITAL_COMMUNITY): Payer: Self-pay | Admitting: Emergency Medicine

## 2016-02-04 DIAGNOSIS — F17200 Nicotine dependence, unspecified, uncomplicated: Secondary | ICD-10-CM | POA: Insufficient documentation

## 2016-02-04 DIAGNOSIS — R112 Nausea with vomiting, unspecified: Secondary | ICD-10-CM

## 2016-02-04 DIAGNOSIS — F10920 Alcohol use, unspecified with intoxication, uncomplicated: Secondary | ICD-10-CM

## 2016-02-04 DIAGNOSIS — F10129 Alcohol abuse with intoxication, unspecified: Secondary | ICD-10-CM | POA: Insufficient documentation

## 2016-02-04 LAB — CBC WITH DIFFERENTIAL/PLATELET
Basophils Absolute: 0 10*3/uL (ref 0.0–0.1)
Basophils Relative: 0 %
EOS ABS: 0 10*3/uL (ref 0.0–0.7)
Eosinophils Relative: 0 %
HCT: 44.1 % (ref 36.0–46.0)
Hemoglobin: 14.5 g/dL (ref 12.0–15.0)
Lymphocytes Relative: 7 %
Lymphs Abs: 1.1 10*3/uL (ref 0.7–4.0)
MCH: 30.3 pg (ref 26.0–34.0)
MCHC: 32.9 g/dL (ref 30.0–36.0)
MCV: 92.3 fL (ref 78.0–100.0)
Monocytes Absolute: 0.3 10*3/uL (ref 0.1–1.0)
Monocytes Relative: 2 %
NEUTROS PCT: 91 %
Neutro Abs: 14.5 10*3/uL — ABNORMAL HIGH (ref 1.7–7.7)
PLATELETS: 257 10*3/uL (ref 150–400)
RBC: 4.78 MIL/uL (ref 3.87–5.11)
RDW: 13.2 % (ref 11.5–15.5)
WBC: 15.9 10*3/uL — AB (ref 4.0–10.5)

## 2016-02-04 LAB — COMPREHENSIVE METABOLIC PANEL
ALT: 19 U/L (ref 14–54)
AST: 23 U/L (ref 15–41)
Albumin: 4.4 g/dL (ref 3.5–5.0)
Alkaline Phosphatase: 56 U/L (ref 38–126)
Anion gap: 10 (ref 5–15)
BUN: 10 mg/dL (ref 6–20)
CHLORIDE: 111 mmol/L (ref 101–111)
CO2: 21 mmol/L — ABNORMAL LOW (ref 22–32)
Calcium: 8.2 mg/dL — ABNORMAL LOW (ref 8.9–10.3)
Creatinine, Ser: 0.71 mg/dL (ref 0.44–1.00)
Glucose, Bld: 154 mg/dL — ABNORMAL HIGH (ref 65–99)
POTASSIUM: 3.8 mmol/L (ref 3.5–5.1)
SODIUM: 142 mmol/L (ref 135–145)
Total Bilirubin: 0.2 mg/dL — ABNORMAL LOW (ref 0.3–1.2)
Total Protein: 7.4 g/dL (ref 6.5–8.1)

## 2016-02-04 LAB — ETHANOL: Alcohol, Ethyl (B): 141 mg/dL — ABNORMAL HIGH (ref <5)

## 2016-02-04 LAB — URINALYSIS, ROUTINE W REFLEX MICROSCOPIC
BILIRUBIN URINE: NEGATIVE
GLUCOSE, UA: 100 mg/dL — AB
KETONES UR: 15 mg/dL — AB
Nitrite: NEGATIVE
PH: 6 (ref 5.0–8.0)
Protein, ur: 30 mg/dL — AB
SPECIFIC GRAVITY, URINE: 1.02 (ref 1.005–1.030)

## 2016-02-04 LAB — URINE MICROSCOPIC-ADD ON

## 2016-02-04 LAB — LIPASE, BLOOD: LIPASE: 25 U/L (ref 11–51)

## 2016-02-04 LAB — I-STAT BETA HCG BLOOD, ED (MC, WL, AP ONLY)

## 2016-02-04 MED ORDER — SODIUM CHLORIDE 0.9 % IV BOLUS (SEPSIS)
1000.0000 mL | Freq: Once | INTRAVENOUS | Status: AC
  Administered 2016-02-04: 1000 mL via INTRAVENOUS

## 2016-02-04 MED ORDER — METOCLOPRAMIDE HCL 5 MG/ML IJ SOLN
10.0000 mg | Freq: Once | INTRAMUSCULAR | Status: AC
  Administered 2016-02-04: 10 mg via INTRAVENOUS
  Filled 2016-02-04: qty 2

## 2016-02-04 MED ORDER — PROMETHAZINE HCL 25 MG PO TABS: 25.0000 mg | ORAL_TABLET | Freq: Four times a day (QID) | ORAL | Status: DC | PRN

## 2016-02-04 NOTE — ED Notes (Signed)
[facility] Expected date:  Expected time:  Means of arrival:  Comments: EMS 30 yo female, intoxicated, nausea and vomiting IV Zofran

## 2016-02-04 NOTE — Discharge Instructions (Signed)
Alcohol Intoxication Alcohol intoxication occurs when you drink enough alcohol that it affects your ability to function. It can be mild or very severe. Drinking a lot of alcohol in a short time is called binge drinking. This can be very harmful. Drinking alcohol can also be more dangerous if you are taking medicines or other drugs. Some of the effects caused by alcohol may include:  Loss of coordination.  Changes in mood and behavior.  Unclear thinking.  Trouble talking (slurred speech).  Throwing up (vomiting).  Confusion.  Slowed breathing.  Twitching and shaking (seizures).  Loss of consciousness. HOME CARE  Do not drive after drinking alcohol.  Drink enough water and fluids to keep your pee (urine) clear or pale yellow. Avoid caffeine.  Only take medicine as told by your doctor. GET HELP IF:  You throw up (vomit) many times.  You do not feel better after a few days.  You frequently have alcohol intoxication. Your doctor can help decide if you should see a substance use treatment counselor. GET HELP RIGHT AWAY IF:  You become shaky when you stop drinking.  You have twitching and shaking.  You throw up blood. It may look bright red or like coffee grounds.  You notice blood in your poop (bowel movements).  You become lightheaded or pass out (faint). MAKE SURE YOU:   Understand these instructions.  Will watch your condition.  Will get help right away if you are not doing well or get worse.   This information is not intended to replace advice given to you by your health care provider. Make sure you discuss any questions you have with your health care provider.   Document Released: 01/24/2008 Document Revised: 04/09/2013 Document Reviewed: 01/10/2013 Elsevier Interactive Patient Education 2016 Elsevier Inc.  Nausea and Vomiting Nausea means you feel sick to your stomach. Throwing up (vomiting) is a reflex where stomach contents come out of your mouth. HOME  CARE   Take medicine as told by your doctor.  Do not force yourself to eat. However, you do need to drink fluids.  If you feel like eating, eat a normal diet as told by your doctor.  Eat rice, wheat, potatoes, bread, lean meats, yogurt, fruits, and vegetables.  Avoid high-fat foods.  Drink enough fluids to keep your pee (urine) clear or pale yellow.  Ask your doctor how to replace body fluid losses (rehydrate). Signs of body fluid loss (dehydration) include:  Feeling very thirsty.  Dry lips and mouth.  Feeling dizzy.  Dark pee.  Peeing less than normal.  Feeling confused.  Fast breathing or heart rate. GET HELP RIGHT AWAY IF:   You have blood in your throw up.  You have black or bloody poop (stool).  You have a bad headache or stiff neck.  You feel confused.  You have bad belly (abdominal) pain.  You have chest pain or trouble breathing.  You do not pee at least once every 8 hours.  You have cold, clammy skin.  You keep throwing up after 24 to 48 hours.  You have a fever. MAKE SURE YOU:   Understand these instructions.  Will watch your condition.  Will get help right away if you are not doing well or get worse.   This information is not intended to replace advice given to you by your health care provider. Make sure you discuss any questions you have with your health care provider.   Document Released: 01/24/2008 Document Revised: 10/30/2011 Document Reviewed: 01/06/2011 Elsevier  Interactive Patient Education ©2016 Elsevier Inc. ° °

## 2016-02-04 NOTE — ED Notes (Signed)
Pt comes to ed via ems, pt drank unknown amount ETOH, 18 LAC ( 4 Zofran given 3 am/  300 bolus in)  Pt comes from home, alert and orientated. Vs on arrival 108/80, hr 92, rr 16,  No medical hx, no meds, no allergies. Pt has been dry heaven ing/ vomiting.  Incontinent.

## 2016-02-04 NOTE — ED Notes (Signed)
Pt ambulated to the restroom without difficulty or assistance.  

## 2016-02-04 NOTE — ED Provider Notes (Signed)
CSN: [id number]     Arrival date & time 02/04/16  0320 History  By signing my name below, I, Jasmyn B. Alexander, attest that this documentation has been prepared under the direction and in the presence of Gilda Creasehristopher J Kumar Falwell, MD.  Electronically Signed: Gillis EndsJasmyn B. Lyn HollingsheadAlexander, ED Scribe. 02/04/2016. 3:42 AM.     Chief Complaint  Patient presents with  . Alcohol Intoxication   The history is provided by the patient. No language interpreter was used.   HPI Comments: [name] is a 30 y.o. female brought in by ambulance, who presents to the Emergency Department complaining of gradual onset, constant episodes of vomiting s/p ETOH intoxication that occurred PTA. Pt does not know how much alcohol she drank. Pt has associated nausea, abdominal pain, and incontinence. There are no modifying factors. Vital signs on arrival per EMS were BP 108/80, HR 92, and RR 16. She also received IV Zo[facility]an [facility]. She does not have a significant medical history nor is she on any daily medications.    Past Medical History  Diagnosis Date  . Bartholin's cyst    History reviewed. No pertinent past surgical history. No family history on file. Social History  Substance Use Topics  . Smoking status: Current Some Day Smoker  . Smokeless tobacco: None  . Alcohol Use: Yes   OB History    No data available     Review of Systems  Constitutional: Negative for fever.  Gastrointestinal: Positive for nausea, vomiting and abdominal pain.  Genitourinary: Positive for frequency.  All other systems reviewed and are negative.  Allergies  Morphine and related  Home Medications   Prior to Admission medications   Medication Sig Start Date End Date Taking? Authorizing Provider  cephALEXin (KEFLEX) 500 MG capsule Take 1 capsule (500 mg total) by mouth 3 (three) times daily. 01/29/16   Hope Orlene OchM Neese, NP  doxycycline (VIBRAMYCIN) 100 MG capsule Take 1 capsule (100 mg total) by mouth 2 (two) times daily.  08/18/15   Azalia BilisKevin Campos, MD  HYDROcodone-acetaminophen (NORCO) 5-325 MG tablet Take 1 tablet by mouth every 6 (six) hours as needed. 01/29/16   Hope Orlene OchM Neese, NP  ibuprofen (ADVIL,MOTRIN) 600 MG tablet Take 1 tablet (600 mg total) by mouth every 8 (eight) hours as needed. 08/18/15   Azalia BilisKevin Campos, MD  levonorgestrel (MIRENA) 20 MCG/24HR IUD 1 each by Intrauterine route once.    Historical Provider, MD  promethazine (PHENERGAN) 25 MG tablet Take 1 tablet (25 mg total) by mouth every 6 (six) hours as needed for nausea or vomiting. 02/04/16   Gilda Creasehristopher J Lametria Klunk, MD  sulfamethoxazole-trimethoprim (BACTRIM DS,SEPTRA DS) 800-160 MG tablet Take 1 tablet by mouth 2 (two) times daily. 01/29/16 02/05/16  Hope Orlene OchM Neese, NP   BP 161/76 mmHg  Pulse 96  Temp(Src) 97.6 F (36.4 C) (Oral)  Resp 18  SpO2 100%  LMP 01/29/2016 Physical Exam  Constitutional: She is oriented to person, place, and time. She appears well-developed and well-nourished. No distress.  HENT:  Head: Normocephalic and atraumatic.  Right Ear: Hearing normal.  Left Ear: Hearing normal.  Nose: Nose normal.  Mouth/Throat: Oropharynx is clear and moist and mucous membranes are normal.  Eyes: Conjunctivae and EOM are normal. Pupils are equal, round, and reactive to light.  Neck: Normal range of motion. Neck supple.  Cardiovascular: Regular rhythm, S1 normal and S2 normal.  Exam reveals no gallop and no friction rub.   No murmur heard. Pulmonary/Chest: Effort normal and  breath sounds normal. No respiratory distress. She exhibits no tenderness.  Abdominal: Soft. Normal appearance and bowel sounds are normal. There is no hepatosplenomegaly. There is tenderness. There is no rebound, no guarding, no tenderness at McBurney's point and negative Murphy's sign. No hernia.  Mild diffuse tenderness  Musculoskeletal: Normal range of motion.  Neurological: She is alert and oriented to person, place, and time. She has normal strength. No cranial nerve  deficit or sensory deficit. Coordination normal. GCS eye subscore is 4. GCS verbal subscore is 5. GCS motor subscore is 6.  Skin: Skin is warm, dry and intact. No rash noted. No cyanosis.  Psychiatric: She has a normal mood and affect. Her speech is normal and behavior is normal. Thought content normal.  Nursing note and vitals reviewed.   ED Course  Procedures (including critical care time) DIAGNOSTIC STUDIES: Oxygen Saturation is 100% on RA, normal by my interpretation.    COORDINATION OF CARE: 3:34 AM-Discussed treatment plan which includes Lipase, CBC panel, CMP, UA, and ETOH with pt at bedside and pt agreed to plan.   Labs Review Labs Reviewed  CBC WITH DIFFERENTIAL/PLATELET - Abnormal; Notable for the following:    WBC 15.9 (*)    Neutro Abs 14.5 (*)    All other components within normal limits  URINALYSIS, ROUTINE W REFLEX MICROSCOPIC (NOT AT [facility]) - Abnormal; Notable for the following:    APPearance CLOUDY (*)    Glucose, UA 100 (*)    Hgb urine dipstick MODERATE (*)    Ketones, ur 15 (*)    Protein, ur 30 (*)    Leukocytes, UA MODERATE (*)    All other components within normal limits  ETHANOL - Abnormal; Notable for the following:    Alcohol, Ethyl (B) 141 (*)    All other components within normal limits  COMPREHENSIVE METABOLIC PANEL - Abnormal; Notable for the following:    CO2 21 (*)    Glucose, Bld 154 (*)    Calcium 8.2 (*)    Total Bilirubin 0.2 (*)    All other components within normal limits  URINE MICROSCOPIC-ADD ON - Abnormal; Notable for the following:    Squamous Epithelial / LPF 0-5 (*)    Bacteria, UA MANY (*)    All other components within normal limits  LIPASE, BLOOD  I-STAT BETA HCG BLOOD, ED ([facility], [facility], [facility] ONLY)     MDM   Final diagnoses:  Nausea and vomiting, vomiting of unspecified type  Alcohol intoxication, uncomplicated (HCC)    Patient presents to the ER for evaluation of nausea, vomiting and abdominal pain. Patient admits to alcohol  intake tonight. She appears to be intoxicated at arrival. She has very mild tenderness on examination, no concern for acute surgical process. Lab work is normal other than nonspecific leukocytosis, slightly elevated glucose at 154 and alcohol 141. Patient hydrated and administered additional antiemetics with continued improvement.  I personally performed the services described in this documentation, which was scribed in my presence. The recorded information has been reviewed and is accurate.    Gilda Crease, MD 02/04/16 [phone]

## 2016-03-02 ENCOUNTER — Encounter: Payer: Self-pay | Admitting: Obstetrics and Gynecology

## 2016-11-21 ENCOUNTER — Encounter (HOSPITAL_COMMUNITY): Payer: Self-pay | Admitting: Emergency Medicine

## 2016-11-21 ENCOUNTER — Emergency Department (HOSPITAL_COMMUNITY): Payer: Self-pay

## 2016-11-21 ENCOUNTER — Emergency Department (HOSPITAL_COMMUNITY)
Admission: EM | Admit: 2016-11-21 | Discharge: 2016-11-21 | Disposition: A | Discharge status: Home / Self Care | Payer: Self-pay | Attending: Emergency Medicine | Admitting: Emergency Medicine

## 2016-11-21 DIAGNOSIS — Z79899 Other long term (current) drug therapy: Secondary | ICD-10-CM | POA: Insufficient documentation

## 2016-11-21 DIAGNOSIS — F17200 Nicotine dependence, unspecified, uncomplicated: Secondary | ICD-10-CM | POA: Insufficient documentation

## 2016-11-21 DIAGNOSIS — E876 Hypokalemia: Secondary | ICD-10-CM | POA: Insufficient documentation

## 2016-11-21 DIAGNOSIS — K2920 Alcoholic gastritis without bleeding: Secondary | ICD-10-CM | POA: Insufficient documentation

## 2016-11-21 LAB — CBC WITH DIFFERENTIAL/PLATELET
Basophils Absolute: 0 10*3/uL (ref 0.0–0.1)
Basophils Relative: 0 %
EOS PCT: 0 %
Eosinophils Absolute: 0 10*3/uL (ref 0.0–0.7)
HCT: 49.1 % — ABNORMAL HIGH (ref 36.0–46.0)
Hemoglobin: 16.2 g/dL — ABNORMAL HIGH (ref 12.0–15.0)
LYMPHS ABS: 1.1 10*3/uL (ref 0.7–4.0)
LYMPHS PCT: 11 %
MCH: 30.3 pg (ref 26.0–34.0)
MCHC: 33 g/dL (ref 30.0–36.0)
MCV: 91.8 fL (ref 78.0–100.0)
MONO ABS: 0.9 10*3/uL (ref 0.1–1.0)
Monocytes Relative: 8 %
Neutro Abs: 8.3 10*3/uL — ABNORMAL HIGH (ref 1.7–7.7)
Neutrophils Relative %: 81 %
PLATELETS: 218 10*3/uL (ref 150–400)
RBC: 5.35 MIL/uL — ABNORMAL HIGH (ref 3.87–5.11)
RDW: 13.6 % (ref 11.5–15.5)
WBC: 10.2 10*3/uL (ref 4.0–10.5)

## 2016-11-21 LAB — COMPREHENSIVE METABOLIC PANEL
ALT: 25 U/L (ref 14–54)
AST: 33 U/L (ref 15–41)
Albumin: 5 g/dL (ref 3.5–5.0)
Alkaline Phosphatase: 62 U/L (ref 38–126)
Anion gap: 15 (ref 5–15)
BUN: 12 mg/dL (ref 6–20)
CHLORIDE: 92 mmol/L — AB (ref 101–111)
CO2: 29 mmol/L (ref 22–32)
CREATININE: 0.75 mg/dL (ref 0.44–1.00)
Calcium: 10 mg/dL (ref 8.9–10.3)
GFR calc Af Amer: >60 mL/min (ref >60)
Glucose, Bld: 121 mg/dL — ABNORMAL HIGH (ref 65–99)
Potassium: 2.9 mmol/L — ABNORMAL LOW (ref 3.5–5.1)
Sodium: 136 mmol/L (ref 135–145)
Total Bilirubin: 1.9 mg/dL — ABNORMAL HIGH (ref 0.3–1.2)
Total Protein: 8.4 g/dL — ABNORMAL HIGH (ref 6.5–8.1)

## 2016-11-21 LAB — URINALYSIS, ROUTINE W REFLEX MICROSCOPIC
BACTERIA UA: NONE SEEN
GLUCOSE, UA: 50 mg/dL — AB
Ketones, ur: 5 mg/dL — AB
Leukocytes, UA: NEGATIVE
NITRITE: NEGATIVE
PROTEIN: 100 mg/dL — AB
Specific Gravity, Urine: 1.026 (ref 1.005–1.030)
pH: 6 (ref 5.0–8.0)

## 2016-11-21 LAB — MAGNESIUM: Magnesium: 2 mg/dL (ref 1.7–2.4)

## 2016-11-21 LAB — LIPASE, BLOOD: LIPASE: 15 U/L (ref 11–51)

## 2016-11-21 LAB — POC URINE PREG, ED: Preg Test, Ur: NEGATIVE

## 2016-11-21 MED ORDER — PANTOPRAZOLE SODIUM 40 MG PO TBEC: 40.0000 mg | DELAYED_RELEASE_TABLET | Freq: Every day | ORAL | 0 refills | Status: DC

## 2016-11-21 MED ORDER — ONDANSETRON 4 MG PO TBDP: 4.0000 mg | ORAL_TABLET | Freq: Three times a day (TID) | ORAL | 0 refills | Status: DC | PRN

## 2016-11-21 MED ORDER — SODIUM CHLORIDE 0.9 % IV BOLUS (SEPSIS)
1000.0000 mL | Freq: Once | INTRAVENOUS | Status: AC
  Administered 2016-11-21: 1000 mL via INTRAVENOUS

## 2016-11-21 MED ORDER — PROMETHAZINE HCL 25 MG PO TABS: 25.0000 mg | ORAL_TABLET | Freq: Four times a day (QID) | ORAL | 0 refills | Status: DC | PRN

## 2016-11-21 MED ORDER — SODIUM CHLORIDE 0.9 % IV SOLN
30.0000 meq | Freq: Once | INTRAVENOUS | Status: AC
  Administered 2016-11-21: 30 meq via INTRAVENOUS
  Filled 2016-11-21: qty 15

## 2016-11-21 MED ORDER — PROMETHAZINE HCL 25 MG/ML IJ SOLN
25.0000 mg | Freq: Once | INTRAMUSCULAR | Status: AC
  Administered 2016-11-21: 25 mg via INTRAVENOUS
  Filled 2016-11-21: qty 1

## 2016-11-21 MED ORDER — ONDANSETRON HCL 4 MG/2ML IJ SOLN
4.0000 mg | Freq: Once | INTRAMUSCULAR | Status: AC
  Administered 2016-11-21: 4 mg via INTRAVENOUS
  Filled 2016-11-21: qty 2

## 2016-11-21 MED ORDER — FAMOTIDINE IN NACL 20-0.9 MG/50ML-% IV SOLN
20.0000 mg | Freq: Once | INTRAVENOUS | Status: AC
  Administered 2016-11-21: 20 mg via INTRAVENOUS
  Filled 2016-11-21: qty 50

## 2016-11-21 MED ORDER — GI COCKTAIL ~~LOC~~
30.0000 mL | Freq: Once | ORAL | Status: AC
  Administered 2016-11-21: 30 mL via ORAL
  Filled 2016-11-21: qty 30

## 2016-11-21 MED ORDER — POTASSIUM CHLORIDE CRYS ER 20 MEQ PO TBCR: 20.0000 meq | EXTENDED_RELEASE_TABLET | Freq: Two times a day (BID) | ORAL | 0 refills | Status: DC

## 2016-11-21 MED ORDER — POTASSIUM CHLORIDE CRYS ER 20 MEQ PO TBCR
40.0000 meq | EXTENDED_RELEASE_TABLET | Freq: Once | ORAL | Status: AC
  Administered 2016-11-21: 40 meq via ORAL
  Filled 2016-11-21: qty 2

## 2016-11-21 NOTE — ED Triage Notes (Signed)
Pt sts vomiting since Saturday; pt sts some blood tinged vomit today

## 2016-11-21 NOTE — ED Provider Notes (Signed)
[facility]-EMERGENCY DEPT Provider Note   CSN: [id number] Arrival date & time: 11/21/16  0749     History   Chief Complaint Chief Complaint  Patient presents with  . Emesis    HPI [name] is a 31 y.o. female.  HPI  31 year old female presents with vomiting and now hematemesis. She states that on the night of 3/31 she drank heavily and went four-wheeling with her friends. Around 3 AM on 4/1 she started having vomiting and diarrhea. The diarrhea stopped later that morning but she has continued to have vomiting. She is unable to keep fluids or food down. This morning she noticed some streak of blood in her emesis. She has been having epigastric abdominal pain as well as chest burning radiating up into her chest. No fevers, dysuria, vaginal bleeding, or discharge. She's had some bilateral low back pain that she thinks is from a fall 4 days ago. No midline back pain. Never fell and hit her head or lost consciousness.  Past Medical History:  Diagnosis Date  . Bartholin's cyst     There are no active problems to display for this patient.   History reviewed. No pertinent surgical history.  OB History    No data available       Home Medications    Prior to Admission medications   Medication Sig Start Date End Date Taking? Authorizing Provider  anti-nausea (EMETROL) solution Take 10 mLs by mouth every 15 (fifteen) minutes as needed for nausea or vomiting.   Yes Historical Provider, MD  levonorgestrel (MIRENA) 20 MCG/24HR IUD 1 each by Intrauterine route once.   Yes Historical Provider, MD  HYDROcodone-acetaminophen (NORCO) 5-325 MG tablet Take 1 tablet by mouth every 6 (six) hours as needed. Patient not taking: Reported on 11/21/2016 01/29/16   Janne Napoleon, NP  ibuprofen (ADVIL,MOTRIN) 600 MG tablet Take 1 tablet (600 mg total) by mouth every 8 (eight) hours as needed. Patient not taking: Reported on 11/21/2016 08/18/15   Azalia Bilis, MD  ondansetron (ZOFRAN ODT) 4 MG disintegrating  tablet Take 1 tablet (4 mg total) by mouth every 8 (eight) hours as needed for nausea or vomiting. 11/21/16   Pricilla Loveless, MD  pantoprazole (PROTONIX) 40 MG tablet Take 1 tablet (40 mg total) by mouth daily. 11/21/16   Pricilla Loveless, MD  potassium chloride SA (K-DUR,KLOR-CON) 20 MEQ tablet Take 1 tablet (20 mEq total) by mouth 2 (two) times daily. 11/21/16   Pricilla Loveless, MD  promethazine (PHENERGAN) 25 MG tablet Take 1 tablet (25 mg total) by mouth every 6 (six) hours as needed for nausea or vomiting. 11/21/16   Pricilla Loveless, MD    Family History History reviewed. No pertinent family history.  Social History Social History  Substance Use Topics  . Smoking status: Current Some Day Smoker  . Smokeless tobacco: Not on file  . Alcohol use Yes     Allergies   Morphine and related   Review of Systems Review of Systems  Constitutional: Negative for fever.  Respiratory: Negative for shortness of breath.   Cardiovascular: Positive for chest pain.  Gastrointestinal: Positive for abdominal pain, diarrhea (now resolved), nausea and vomiting.  Genitourinary: Negative for dysuria, vaginal bleeding and vaginal discharge.  Musculoskeletal: Positive for back pain.  All other systems reviewed and are negative.    Physical Exam Updated Vital Signs BP 135/89   Pulse 73   Temp 98.5 F (36.9 C) (Oral)   Resp 16   LMP 10/20/2016   SpO2 98%  Physical Exam  Constitutional: She is oriented to person, place, and time. She appears well-developed and well-nourished.  HENT:  Head: Normocephalic and atraumatic.  Right Ear: External ear normal.  Left Ear: External ear normal.  Nose: Nose normal.  Eyes: Right eye exhibits no discharge. Left eye exhibits no discharge.  Cardiovascular: Normal rate, regular rhythm and normal heart sounds.   Pulmonary/Chest: Effort normal and breath sounds normal.  Abdominal: Soft. There is tenderness in the epigastric area.  Musculoskeletal:       Thoracic  back: She exhibits no tenderness and no bony tenderness.       Lumbar back: She exhibits no tenderness and no bony tenderness.  Neurological: She is alert and oriented to person, place, and time.  Skin: Skin is warm and dry. She is not diaphoretic.  Nursing note and vitals reviewed.    ED Treatments / Results  Labs (all labs ordered are listed, but only abnormal results are displayed) Labs Reviewed  COMPREHENSIVE METABOLIC PANEL - Abnormal; Notable for the following:       Result Value   Potassium 2.9 (*)    Chloride 92 (*)    Glucose, Bld 121 (*)    Total Protein 8.4 (*)    Total Bilirubin 1.9 (*)    All other components within normal limits  CBC WITH DIFFERENTIAL/PLATELET - Abnormal; Notable for the following:    RBC 5.35 (*)    Hemoglobin 16.2 (*)    HCT 49.1 (*)    Neutro Abs 8.3 (*)    All other components within normal limits  URINALYSIS, ROUTINE W REFLEX MICROSCOPIC - Abnormal; Notable for the following:    Color, Urine Mariza (*)    APPearance HAZY (*)    Glucose, UA 50 (*)    Hgb urine dipstick MODERATE (*)    Bilirubin Urine SMALL (*)    Ketones, ur 5 (*)    Protein, ur 100 (*)    Squamous Epithelial / LPF 6-30 (*)    All other components within normal limits  LIPASE, BLOOD  MAGNESIUM  POC URINE PREG, ED    EKG  EKG Interpretation None       Radiology Dg Chest 2 View  Result Date: 11/21/2016 CLINICAL DATA:  Chest and epigastric pain and hematemesis for the past 2-3 days. EXAM: CHEST  2 VIEW COMPARISON:  Chest x-ray of March 04, 2009 FINDINGS: The lungs are chronically hyperinflated. There is no focal infiltrate. There is no pleural effusion. The heart and pulmonary vascularity are normal. The mediastinum is normal in width. The trachea is midline. The bony thorax exhibits no acute abnormality. IMPRESSION: Hyperinflation consistent with reactive airway disease. There is no pneumonia, CHF, nor other acute cardiopulmonary abnormality. Electronically Signed   By:  David  [place] M.D.   On: 11/21/2016 08:48    Procedures Procedures (including critical care time)  Medications Ordered in ED Medications  sodium chloride 0.9 % bolus 1,000 mL (0 mLs Intravenous Stopped 11/21/16 1257)  ondansetron (ZOFRAN) injection 4 mg (4 mg Intravenous Given 11/21/16 0900)  sodium chloride 0.9 % bolus 1,000 mL (0 mLs Intravenous Stopped 11/21/16 1003)  famotidine (PEPCID) IVPB 20 mg premix (0 mg Intravenous Stopped 11/21/16 0931)  potassium chloride SA (K-DUR,KLOR-CON) CR tablet 40 mEq (40 mEq Oral Given 11/21/16 1011)  potassium chloride 30 mEq in sodium chloride 0.9 % 265 mL (KCL MULTIRUN) IVPB (30 mEq Intravenous Given 11/21/16 1027)  gi cocktail (Maalox,Lidocaine,Donnatal) (30 mLs Oral Given 11/21/16 1011)  promethazine (PHENERGAN) injection 25  mg (25 mg Intravenous Given 11/21/16 1010)     Initial Impression / Assessment and Plan / ED Course  I have reviewed the triage vital signs and the nursing notes.  Pertinent labs & imaging results that were available during my care of the patient were reviewed by me and considered in my medical decision making (see chart for details).  Clinical Course as of Nov 22 1554  Tue Nov 21, 2016  0821 Most likely this is secondary to the ETOH gastritis or viral GI illness. Fluids, zofran, labs, pepcid. Has epigastric pain but this is expected and I doubt acute cholecystitis, SBO, or other abd emergency.  [SG]  [id number] Still nauseated with some dry heaving. Will add on magnesium given low K. Replete K orally and IV. Add on GI cocktail and phenergan. Abd pain improving but still present.  [SG]    Clinical Course User Index [SG] Pricilla Loveless, MD    Magnesium is normal. At this point she's having no further vomiting and abdominal pain feels much better. I think this is most likely alcoholic gastritis. Will discharge with Zofran, Phenergan, and potassium replacement. Follow-up with PCP. Discussed return precautions.  Final Clinical Impressions(s) /  ED Diagnoses   Final diagnoses:  Acute alcoholic gastritis without hemorrhage  Hypokalemia    New Prescriptions Discharge Medication List as of 11/21/2016 11:42 AM    START taking these medications   Details  ondansetron (ZOFRAN ODT) 4 MG disintegrating tablet Take 1 tablet (4 mg total) by mouth every 8 (eight) hours as needed for nausea or vomiting., Starting Tue 11/21/2016, Print    pantoprazole (PROTONIX) 40 MG tablet Take 1 tablet (40 mg total) by mouth daily., Starting Tue 11/21/2016, Print    potassium chloride SA (K-DUR,KLOR-CON) 20 MEQ tablet Take 1 tablet (20 mEq total) by mouth 2 (two) times daily., Starting Tue 11/21/2016, Print         Pricilla Loveless, MD 11/21/16 [phone]

## 2016-11-21 NOTE — ED Notes (Signed)
Patient transported to X-ray 

## 2017-04-04 ENCOUNTER — Other Ambulatory Visit (HOSPITAL_COMMUNITY): Payer: Self-pay

## 2017-04-04 ENCOUNTER — Encounter (HOSPITAL_COMMUNITY): Payer: Self-pay | Admitting: Emergency Medicine

## 2017-04-04 ENCOUNTER — Emergency Department (HOSPITAL_COMMUNITY)
Admission: EM | Admit: 2017-04-04 | Discharge: 2017-04-04 | Disposition: A | Discharge status: Home / Self Care | Payer: Self-pay | Attending: Physician Assistant | Admitting: Physician Assistant

## 2017-04-04 DIAGNOSIS — T8332XA Displacement of intrauterine contraceptive device, initial encounter: Secondary | ICD-10-CM

## 2017-04-04 DIAGNOSIS — F17200 Nicotine dependence, unspecified, uncomplicated: Secondary | ICD-10-CM | POA: Insufficient documentation

## 2017-04-04 DIAGNOSIS — Z79899 Other long term (current) drug therapy: Secondary | ICD-10-CM | POA: Insufficient documentation

## 2017-04-04 DIAGNOSIS — N760 Acute vaginitis: Secondary | ICD-10-CM | POA: Insufficient documentation

## 2017-04-04 DIAGNOSIS — T8339XA Other mechanical complication of intrauterine contraceptive device, initial encounter: Secondary | ICD-10-CM | POA: Insufficient documentation

## 2017-04-04 DIAGNOSIS — B9689 Other specified bacterial agents as the cause of diseases classified elsewhere: Secondary | ICD-10-CM

## 2017-04-04 DIAGNOSIS — Y768 Miscellaneous obstetric and gynecological devices associated with adverse incidents, not elsewhere classified: Secondary | ICD-10-CM | POA: Insufficient documentation

## 2017-04-04 LAB — URINALYSIS, ROUTINE W REFLEX MICROSCOPIC
BILIRUBIN URINE: NEGATIVE
GLUCOSE, UA: NEGATIVE mg/dL
Ketones, ur: NEGATIVE mg/dL
NITRITE: NEGATIVE
PROTEIN: 100 mg/dL — AB
Specific Gravity, Urine: 1.027 (ref 1.005–1.030)
pH: 5 (ref 5.0–8.0)

## 2017-04-04 LAB — WET PREP, GENITAL
TRICH WET PREP: NONE SEEN
YEAST WET PREP: NONE SEEN

## 2017-04-04 LAB — COMPREHENSIVE METABOLIC PANEL
ALBUMIN: 4.3 g/dL (ref 3.5–5.0)
ALK PHOS: 49 U/L (ref 38–126)
ALT: 15 U/L (ref 14–54)
ANION GAP: 10 (ref 5–15)
AST: 19 U/L (ref 15–41)
BILIRUBIN TOTAL: 1.1 mg/dL (ref 0.3–1.2)
BUN: 12 mg/dL (ref 6–20)
CALCIUM: 9.4 mg/dL (ref 8.9–10.3)
CO2: 22 mmol/L (ref 22–32)
Chloride: 107 mmol/L (ref 101–111)
Creatinine, Ser: 0.76 mg/dL (ref 0.44–1.00)
GLUCOSE: 113 mg/dL — AB (ref 65–99)
POTASSIUM: 3.7 mmol/L (ref 3.5–5.1)
SODIUM: 139 mmol/L (ref 135–145)
TOTAL PROTEIN: 7.3 g/dL (ref 6.5–8.1)

## 2017-04-04 LAB — CBC
HEMATOCRIT: 43.8 % (ref 36.0–46.0)
HEMOGLOBIN: 14 g/dL (ref 12.0–15.0)
MCH: 30.2 pg (ref 26.0–34.0)
MCHC: 32 g/dL (ref 30.0–36.0)
MCV: 94.4 fL (ref 78.0–100.0)
Platelets: 217 10*3/uL (ref 150–400)
RBC: 4.64 MIL/uL (ref 3.87–5.11)
RDW: 13.7 % (ref 11.5–15.5)
WBC: 5.2 10*3/uL (ref 4.0–10.5)

## 2017-04-04 LAB — LIPASE, BLOOD: LIPASE: 29 U/L (ref 11–51)

## 2017-04-04 LAB — I-STAT BETA HCG BLOOD, ED (MC, WL, AP ONLY)

## 2017-04-04 LAB — PREGNANCY, URINE: Preg Test, Ur: NEGATIVE

## 2017-04-04 MED ORDER — METRONIDAZOLE 500 MG PO TABS: 500.0000 mg | ORAL_TABLET | Freq: Two times a day (BID) | ORAL | 0 refills | Status: AC

## 2017-04-04 NOTE — ED Triage Notes (Signed)
Pt reports iud in since 2010, reports lower abdominal pain that began last night, had vomiting this morning that has now subsided. A/ox4, resp e/u, nad.

## 2017-04-04 NOTE — Discharge Instructions (Signed)
The plan is for you to follow up with your OB/GYN in the next several hours to locate your Mirena. Please return with any abdominal pain. Please return with any concerns, pain, SOB, fever.  We have explained the risk of leaving without locating yiour Mirena is very high. You are willing to take this risk. However you are  welcome back to the emergency department if you change your mind or have any concerns.

## 2017-04-04 NOTE — ED Provider Notes (Signed)
[facility]-EMERGENCY DEPT Provider Note   CSN: [id number] Arrival date & time: 04/04/17  0944     History   Chief Complaint Chief Complaint  Patient presents with  . Abdominal Pain    HPI [name] is a 31 y.o. female.  Patient is a 31yo F who presents with abdominal pain. States started having suprapubic abdominal pain around 2am this morning, intermittent, achy, rates 4/10. Feels similar to bartholin cyst had in the past but at that time felt more external pain and this feels more internal to her. No vaginal discharge or bleeding. Having some mild nausea but no vomiting. No fever/chills, no urinary symptoms. States has Mirena IUD in since 2010, was able to feel it last night.      Past Medical History:  Diagnosis Date  . Bartholin's cyst     There are no active problems to display for this patient.   History reviewed. No pertinent surgical history.  OB History    No data available       Home Medications    Prior to Admission medications   Medication Sig Start Date End Date Taking? Authorizing Provider  anti-nausea (EMETROL) solution Take 10 mLs by mouth every 15 (fifteen) minutes as needed for nausea or vomiting.    [provider]  HYDROcodone-acetaminophen (NORCO) 5-325 MG tablet Take 1 tablet by mouth every 6 (six) hours as needed. Patient not taking: Reported on 11/21/2016 01/29/16   Janne Napoleon, NP  ibuprofen (ADVIL,MOTRIN) 600 MG tablet Take 1 tablet (600 mg total) by mouth every 8 (eight) hours as needed. Patient not taking: Reported on 11/21/2016 08/18/15   Azalia Bilis, MD  levonorgestrel [facility]) 20 MCG/24HR IUD 1 each by Intrauterine route once.    [provider]  ondansetron (ZOFRAN ODT) 4 MG disintegrating tablet Take 1 tablet (4 mg total) by mouth every 8 (eight) hours as needed for nausea or vomiting. 11/21/16   Pricilla Loveless, MD  pantoprazole (PROTONIX) 40 MG tablet Take 1 tablet (40 mg total) by mouth daily. 11/21/16   Pricilla Loveless, MD  potassium chloride SA (K-DUR,KLOR-CON) 20 MEQ tablet Take 1 tablet (20 mEq total) by mouth 2 (two) times daily. 11/21/16   Pricilla Loveless, MD  promethazine (PHENERGAN) 25 MG tablet Take 1 tablet (25 mg total) by mouth every 6 (six) hours as needed for nausea or vomiting. 11/21/16   Pricilla Loveless, MD    Family History No family history on file.  Social History Social History  Substance Use Topics  . Smoking status: Current Some Day Smoker  . Smokeless tobacco: Not on file  . Alcohol use Yes     Allergies   Morphine and related   Review of Systems Review of Systems  Constitutional: Negative for chills and fever.  Respiratory: Negative for shortness of breath.   Cardiovascular: Negative for chest pain.  Gastrointestinal: Positive for abdominal pain (suprapubic ) and nausea. Negative for constipation, diarrhea and vomiting.  Genitourinary: Negative for difficulty urinating, dysuria, frequency, hematuria, menstrual problem, urgency, vaginal bleeding, vaginal discharge and vaginal pain.  Skin: Negative for rash.     Physical Exam Updated Vital Signs BP (!) 132/120 (BP Location: Right Arm)   Pulse (!) 109   Temp 98.6 F (37 C) (Oral)   Resp (!) 21   SpO2 100%   Physical Exam  Constitutional: She is oriented to person, place, and time. She appears well-developed and well-nourished. No distress.  HENT:  Head: Normocephalic and atraumatic.  Nose: Nose normal.  Eyes: Conjunctivae and EOM are normal.  Neck: Normal range of motion.  Cardiovascular: Normal rate, regular rhythm, normal heart sounds and intact distal pulses.   No murmur heard. Pulmonary/Chest: Effort normal and breath sounds normal. No respiratory distress.  Abdominal: Soft. Bowel sounds are normal. She exhibits no distension and no mass. There is tenderness (suprapubic). There is no rebound and no guarding.  Genitourinary: Vaginal discharge (scant yellow malodorous discharge) found.  Genitourinary  Comments: Cervix without lesions, IUD strings not visualized. No CMT   Musculoskeletal: Normal range of motion.  Neurological: She is alert and oriented to person, place, and time.  Skin: Skin is warm and dry. Capillary refill takes less than 2 seconds. No rash noted.  Psychiatric: She has a normal mood and affect.     ED Treatments / Results  Labs (all labs ordered are listed, but only abnormal results are displayed) Labs Reviewed  WET PREP, GENITAL - Abnormal; Notable for the following:       Result Value   Clue Cells Wet Prep HPF POC PRESENT (*)    WBC, Wet Prep HPF POC MANY (*)    All other components within normal limits  COMPREHENSIVE METABOLIC PANEL - Abnormal; Notable for the following:    Glucose, Bld 113 (*)    All other components within normal limits  URINALYSIS, ROUTINE W REFLEX MICROSCOPIC - Abnormal; Notable for the following:    Color, Urine Verle (*)    APPearance CLOUDY (*)    Hgb urine dipstick SMALL (*)    Protein, ur 100 (*)    Leukocytes, UA SMALL (*)    Bacteria, UA RARE (*)    Squamous Epithelial / LPF 6-30 (*)    All other components within normal limits  LIPASE, BLOOD  CBC  PREGNANCY, URINE  I-STAT BETA HCG BLOOD, ED ([facility], [facility], [facility] ONLY)  GC/CHLAMYDIA PROBE AMP ([place]    EKG  EKG Interpretation None       Radiology No results found.  Procedures Procedures (including critical care time)  Medications Ordered in ED Medications - No data to display   Initial Impression / Assessment and Plan / ED Course  I have reviewed the triage vital signs and the nursing notes.  Pertinent labs & imaging results that were available during my care of the patient were reviewed by me and considered in my medical decision making (see chart for details).   Patient is a 31yo who presents with suprapubic abdominal pain. Wet prep shows clue cells. Will treat for BV. Since was unable to visualize IUD strings on pelvic exam, would have preferred  to obtain KUB but patient declined. Voiced good understanding that risk of leaving without locating IUD is very high. Patient instructed to follow up with OBGYN.  Final Clinical Impressions(s) / ED Diagnoses   Final diagnoses:  Bacterial vaginosis  BV (bacterial vaginosis)  Intrauterine contraceptive device threads lost, initial encounter    New Prescriptions New Prescriptions   METRONIDAZOLE (FLAGYL) 500 MG TABLET    Take 1 tablet (500 mg total) by mouth 2 (two) times daily.     Leland HerYoo, Jacy Brocker J, DO 04/04/17 1354    Abelino DerrickMackuen, Courteney Lyn, MD 04/11/17 2150

## 2017-04-05 LAB — GC/CHLAMYDIA PROBE AMP (~~LOC~~) NOT AT ARMC
CHLAMYDIA, DNA PROBE: NEGATIVE
Neisseria Gonorrhea: NEGATIVE

## 2018-03-30 ENCOUNTER — Emergency Department
Admission: EM | Admit: 2018-03-30 | Discharge: 2018-03-30 | Disposition: A | Discharge status: Home / Self Care | Payer: Self-pay | Attending: Emergency Medicine | Admitting: Emergency Medicine

## 2018-03-30 ENCOUNTER — Other Ambulatory Visit: Payer: Self-pay

## 2018-03-30 ENCOUNTER — Emergency Department: Payer: Self-pay

## 2018-03-30 DIAGNOSIS — Y998 Other external cause status: Secondary | ICD-10-CM | POA: Insufficient documentation

## 2018-03-30 DIAGNOSIS — W010XXA Fall on same level from slipping, tripping and stumbling without subsequent striking against object, initial encounter: Secondary | ICD-10-CM | POA: Insufficient documentation

## 2018-03-30 DIAGNOSIS — Z79899 Other long term (current) drug therapy: Secondary | ICD-10-CM | POA: Insufficient documentation

## 2018-03-30 DIAGNOSIS — Y9389 Activity, other specified: Secondary | ICD-10-CM | POA: Insufficient documentation

## 2018-03-30 DIAGNOSIS — F17200 Nicotine dependence, unspecified, uncomplicated: Secondary | ICD-10-CM | POA: Insufficient documentation

## 2018-03-30 DIAGNOSIS — Y92410 Unspecified street and highway as the place of occurrence of the external cause: Secondary | ICD-10-CM | POA: Insufficient documentation

## 2018-03-30 DIAGNOSIS — S43401A Unspecified sprain of right shoulder joint, initial encounter: Secondary | ICD-10-CM | POA: Insufficient documentation

## 2018-03-30 MED ORDER — NAPROXEN 500 MG PO TABS
ORAL_TABLET | ORAL | Status: AC
  Administered 2018-03-30: 500 mg via ORAL
  Filled 2018-03-30: qty 1

## 2018-03-30 MED ORDER — NAPROXEN 500 MG PO TABS
500.0000 mg | ORAL_TABLET | Freq: Once | ORAL | Status: AC
  Administered 2018-03-30: 500 mg via ORAL

## 2018-03-30 MED ORDER — CYCLOBENZAPRINE HCL 10 MG PO TABS: 10.0000 mg | ORAL_TABLET | Freq: Three times a day (TID) | ORAL | 0 refills | Status: DC | PRN

## 2018-03-30 MED ORDER — NAPROXEN 500 MG PO TABS: 500.0000 mg | ORAL_TABLET | Freq: Two times a day (BID) | ORAL | 0 refills | Status: DC
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
## 2018-03-30 NOTE — ED Triage Notes (Signed)
Patient reports pain to right arm after a fall last pm.  Reports pain from wrist to shoulder.

## 2018-04-08 ENCOUNTER — Encounter: Payer: Self-pay | Admitting: Emergency Medicine

## 2018-04-08 ENCOUNTER — Other Ambulatory Visit: Payer: Self-pay

## 2018-04-08 ENCOUNTER — Emergency Department
Admission: EM | Admit: 2018-04-08 | Discharge: 2018-04-08 | Disposition: A | Discharge status: Home / Self Care | Payer: Self-pay | Attending: Emergency Medicine | Admitting: Emergency Medicine

## 2018-04-08 ENCOUNTER — Emergency Department: Payer: Self-pay

## 2018-04-08 DIAGNOSIS — M542 Cervicalgia: Secondary | ICD-10-CM | POA: Insufficient documentation

## 2018-04-08 DIAGNOSIS — Z5321 Procedure and treatment not carried out due to patient leaving prior to being seen by health care provider: Secondary | ICD-10-CM | POA: Insufficient documentation

## 2018-04-08 NOTE — ED Notes (Signed)
Pt outside sitting on bench;  says she doesn't want an xray because she doesn't think it's going to show anything; says "I'm alright, but I'm not alright"; explained to pt that it will be difficult to know what's going on in her throat without pictures; pt continues to refuse xray; pt unsure at this time if she's going to leave without being seen; encouraged to stay to see provider; boyfriend with pt;

## 2018-04-08 NOTE — ED Triage Notes (Addendum)
Pt says she was walking to work around 5pm to pick up her pay check when she fell; pt says when she landed she hit the center of her throat on a rock; pt says the area is swollen and she's been spitting up blood; no marks on her throat visualized by this nurse; no marks/abrasions noted on palms, knees or any other body parts; no swelling visualized by this nurse in triage; pt says she can't talk but then speaks in full sentences; spitting out clear liquid only, no blood;
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
## 2018-04-08 NOTE — ED Notes (Signed)
Patient refusing x-ray at this time.

## 2018-04-08 NOTE — ED Notes (Signed)
Unable to locate patient in waiting room or outside to provide update regarding wait time.

## 2018-05-24 ENCOUNTER — Emergency Department (HOSPITAL_COMMUNITY): Payer: Self-pay

## 2018-05-24 ENCOUNTER — Encounter (HOSPITAL_COMMUNITY): Payer: Self-pay | Admitting: Emergency Medicine

## 2018-05-24 ENCOUNTER — Other Ambulatory Visit: Payer: Self-pay

## 2018-05-24 ENCOUNTER — Emergency Department (HOSPITAL_COMMUNITY)
Admission: EM | Admit: 2018-05-24 | Discharge: 2018-05-24 | Disposition: A | Discharge status: Home / Self Care | Payer: Self-pay | Attending: Emergency Medicine | Admitting: Emergency Medicine

## 2018-05-24 DIAGNOSIS — Y999 Unspecified external cause status: Secondary | ICD-10-CM | POA: Insufficient documentation

## 2018-05-24 DIAGNOSIS — S0083XA Contusion of other part of head, initial encounter: Secondary | ICD-10-CM | POA: Insufficient documentation

## 2018-05-24 DIAGNOSIS — Z79899 Other long term (current) drug therapy: Secondary | ICD-10-CM | POA: Insufficient documentation

## 2018-05-24 DIAGNOSIS — F10920 Alcohol use, unspecified with intoxication, uncomplicated: Secondary | ICD-10-CM | POA: Insufficient documentation

## 2018-05-24 DIAGNOSIS — Y9389 Activity, other specified: Secondary | ICD-10-CM | POA: Insufficient documentation

## 2018-05-24 DIAGNOSIS — Y9259 Other trade areas as the place of occurrence of the external cause: Secondary | ICD-10-CM | POA: Insufficient documentation

## 2018-05-24 LAB — ETHANOL: ALCOHOL ETHYL (B): 247 mg/dL — AB (ref <10)

## 2018-05-24 LAB — I-STAT BETA HCG BLOOD, ED (MC, WL, AP ONLY): I-stat hCG, quantitative: <5 m[IU]/mL (ref <5)

## 2018-05-24 MED ORDER — ACETAMINOPHEN 325 MG PO TABS: 650.0000 mg | ORAL_TABLET | Freq: Once | ORAL | Status: DC

## 2018-05-24 MED ORDER — TETANUS-DIPHTH-ACELL PERTUSSIS 5-2.5-18.5 LF-MCG/0.5 IM SUSP
0.5000 mL | Freq: Once | INTRAMUSCULAR | Status: DC
  Filled 2018-05-24: qty 0.5

## 2018-05-24 NOTE — ED Notes (Signed)
Pt stated she did not want to wait for test results any longer.   Pt was encouraged to stay a bit longer, but as soon as staff were out of sight, pt left the room and the emergency department
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
## 2018-12-11 ENCOUNTER — Encounter (HOSPITAL_COMMUNITY): Payer: Self-pay | Admitting: Emergency Medicine

## 2018-12-11 ENCOUNTER — Other Ambulatory Visit: Payer: Self-pay

## 2018-12-11 ENCOUNTER — Emergency Department (HOSPITAL_COMMUNITY)
Admission: EM | Admit: 2018-12-11 | Discharge: 2018-12-11 | Disposition: A | Discharge status: Home / Self Care | Payer: Self-pay | Attending: Emergency Medicine | Admitting: Emergency Medicine

## 2018-12-11 DIAGNOSIS — Z79899 Other long term (current) drug therapy: Secondary | ICD-10-CM | POA: Insufficient documentation

## 2018-12-11 DIAGNOSIS — R112 Nausea with vomiting, unspecified: Secondary | ICD-10-CM

## 2018-12-11 DIAGNOSIS — K2920 Alcoholic gastritis without bleeding: Secondary | ICD-10-CM | POA: Insufficient documentation

## 2018-12-11 LAB — COMPREHENSIVE METABOLIC PANEL
ALT: 18 U/L (ref 0–44)
AST: 23 U/L (ref 15–41)
Albumin: 4.6 g/dL (ref 3.5–5.0)
Alkaline Phosphatase: 64 U/L (ref 38–126)
Anion gap: 16 — ABNORMAL HIGH (ref 5–15)
BUN: 11 mg/dL (ref 6–20)
CO2: 20 mmol/L — ABNORMAL LOW (ref 22–32)
Calcium: 8.8 mg/dL — ABNORMAL LOW (ref 8.9–10.3)
Chloride: 104 mmol/L (ref 98–111)
Creatinine, Ser: 0.64 mg/dL (ref 0.44–1.00)
GFR calc Af Amer: >60 mL/min (ref >60)
GFR calc non Af Amer: >60 mL/min (ref >60)
Glucose, Bld: 135 mg/dL — ABNORMAL HIGH (ref 70–99)
Potassium: 3.3 mmol/L — ABNORMAL LOW (ref 3.5–5.1)
Sodium: 140 mmol/L (ref 135–145)
Total Bilirubin: 0.5 mg/dL (ref 0.3–1.2)
Total Protein: 8.1 g/dL (ref 6.5–8.1)

## 2018-12-11 LAB — CBC WITH DIFFERENTIAL/PLATELET
Abs Immature Granulocytes: 0.05 10*3/uL (ref 0.00–0.07)
Basophils Absolute: 0.1 10*3/uL (ref 0.0–0.1)
Basophils Relative: 0 %
Eosinophils Absolute: 0.1 10*3/uL (ref 0.0–0.5)
Eosinophils Relative: 1 %
HCT: 46 % (ref 36.0–46.0)
Hemoglobin: 14.7 g/dL (ref 12.0–15.0)
Immature Granulocytes: 0 %
Lymphocytes Relative: 11 %
Lymphs Abs: 1.5 10*3/uL (ref 0.7–4.0)
MCH: 30.6 pg (ref 26.0–34.0)
MCHC: 32 g/dL (ref 30.0–36.0)
MCV: 95.6 fL (ref 80.0–100.0)
Monocytes Absolute: 0.4 10*3/uL (ref 0.1–1.0)
Monocytes Relative: 3 %
Neutro Abs: 11.3 10*3/uL — ABNORMAL HIGH (ref 1.7–7.7)
Neutrophils Relative %: 85 %
Platelets: 261 10*3/uL (ref 150–400)
RBC: 4.81 MIL/uL (ref 3.87–5.11)
RDW: 13.3 % (ref 11.5–15.5)
WBC: 13.3 10*3/uL — ABNORMAL HIGH (ref 4.0–10.5)
nRBC: 0 % (ref 0.0–0.2)

## 2018-12-11 LAB — LIPASE, BLOOD: Lipase: 25 U/L (ref 11–51)

## 2018-12-11 LAB — I-STAT BETA HCG BLOOD, ED (MC, WL, AP ONLY): I-stat hCG, quantitative: <5 m[IU]/mL (ref <5)

## 2018-12-11 MED ORDER — PROMETHAZINE HCL 25 MG/ML IJ SOLN
INTRAMUSCULAR | Status: AC
  Filled 2018-12-11: qty 1

## 2018-12-11 MED ORDER — SODIUM CHLORIDE 0.9 % IV BOLUS (SEPSIS)
1000.0000 mL | Freq: Once | INTRAVENOUS | Status: AC
  Administered 2018-12-11: 1000 mL via INTRAVENOUS

## 2018-12-11 MED ORDER — PROMETHAZINE HCL 25 MG/ML IJ SOLN
12.5000 mg | Freq: Once | INTRAMUSCULAR | Status: AC
  Administered 2018-12-11: 12.5 mg via INTRAVENOUS

## 2018-12-11 MED ORDER — PROMETHAZINE HCL 25 MG PO TABS: 25.0000 mg | ORAL_TABLET | Freq: Four times a day (QID) | ORAL | 0 refills | Status: DC | PRN

## 2018-12-11 MED ORDER — SODIUM CHLORIDE 0.9 % IV BOLUS (SEPSIS)
1000.0000 mL | Freq: Once | INTRAVENOUS | Status: AC
  Administered 2018-12-11: 13:00:00 1000 mL via INTRAVENOUS

## 2018-12-11 MED ORDER — ONDANSETRON HCL 4 MG/2ML IJ SOLN
4.0000 mg | Freq: Once | INTRAMUSCULAR | Status: AC
  Administered 2018-12-11: 07:00:00 4 mg via INTRAVENOUS
  Filled 2018-12-11: qty 2

## 2018-12-11 MED ORDER — PROMETHAZINE HCL 25 MG/ML IJ SOLN
12.5000 mg | Freq: Once | INTRAMUSCULAR | Status: AC
  Administered 2018-12-11: 08:00:00 12.5 mg via INTRAVENOUS

## 2018-12-11 MED ORDER — SODIUM CHLORIDE 0.9 % IV BOLUS (SEPSIS)
1000.0000 mL | Freq: Once | INTRAVENOUS | Status: AC
  Administered 2018-12-11: 07:00:00 1000 mL via INTRAVENOUS

## 2018-12-11 NOTE — ED Provider Notes (Signed)
[facility] EMERGENCY DEPARTMENT Provider Note   CSN: [id number] Arrival date & time: 12/11/18  0938    History   Chief Complaint Chief Complaint  Patient presents with  . Emesis    HPI [name] is a 33 y.o. female.     The history is provided by the patient.  Emesis  Severity:  Severe Duration:  18 hours Timing:  Intermittent Progression:  Worsening Chronicity:  New Relieved by:  Nothing Worsened by:  Nothing Associated symptoms: abdominal pain, cough and diarrhea   Associated symptoms: no fever   Risk factors: alcohol use    Patient reports onset of vomiting about 18 hours ago-he has had multiple episodes.  She also reports 2 episodes of diarrhea.  No blood in his stool or vomit.  No fevers. Does report recent alcohol use, but denies daily use.  Past Medical History:  Diagnosis Date  . Bartholin's cyst     There are no active problems to display for this patient.   History reviewed. No pertinent surgical history.   OB History   No obstetric history on file.      Home Medications    Prior to Admission medications   Medication Sig Start Date End Date Taking? Authorizing Provider  levonorgestrel (MIRENA) 20 MCG/24HR IUD 1 each by Intrauterine route once.    [provider]    Family History History reviewed. No pertinent family history.  Social History Social History   Tobacco Use  . Smoking status: Never Smoker  . Smokeless tobacco: Never Used  Substance Use Topics  . Alcohol use: Yes    Alcohol/week: 2.0 standard drinks    Types: 2 Glasses of wine per week  . Drug use: No     Allergies   Morphine and related   Review of Systems Review of Systems  Constitutional: Negative for fever.  Respiratory: Positive for cough.   Gastrointestinal: Positive for abdominal pain, diarrhea and vomiting. Negative for blood in stool.  All other systems reviewed and are negative.    Physical Exam Updated Vital Signs BP (!) 135/101    Pulse (!) 120   Temp 98 F (36.7 C)   Ht 1.702 m (5\' 7" )   Wt 63.5 kg   LMP 12/04/2018   SpO2 99%   BMI 21.93 kg/m   Physical Exam CONSTITUTIONAL: Well developed, but ill-appearing and actively vomiting HEAD: Normocephalic/atraumatic EYES: EOMI ENMT: Mucous membranes moist NECK: supple no meningeal signs SPINE/BACK:entire spine nontender CV: S1/S2 noted, no murmurs/rubs/gallops noted LUNGS: Lungs are clear to auscultation bilaterally, no apparent distress ABDOMEN: soft, mild diffuse tenderness, no rebound or guarding, bowel sounds noted throughout abdomen GU:no cva tenderness NEURO: Pt is awake/alert/appropriate, moves all extremitiesx4.  No facial droop.   EXTREMITIES: pulses normal/equal, full ROM SKIN: warm, color normal PSYCH: no abnormalities of mood noted, alert and oriented to situation   ED Treatments / Results  Labs (all labs ordered are listed, but only abnormal results are displayed) Labs Reviewed  CBC WITH DIFFERENTIAL/PLATELET  COMPREHENSIVE METABOLIC PANEL  LIPASE, BLOOD  I-STAT BETA HCG BLOOD, ED ([facility], [facility], [facility] ONLY)    EKG None  Radiology No results found.  Procedures Procedures  Medications Ordered in ED Medications  sodium chloride 0.9 % bolus 1,000 mL (1,000 mLs Intravenous New Bag/Given 12/11/18 0641)  ondansetron (ZOFRAN) injection 4 mg (4 mg Intravenous Given 12/11/18 0641)     Initial Impression / Assessment and Plan / ED Course  I have reviewed the triage vital signs and  the nursing notes.  Pertinent labs  results that were available during my care of the patient were reviewed by me and considered in my medical decision making (see chart for details).        6:55 AM Pt with vomiting and some diarrhea Reports it all started after drinking ETOH She reports similar episodes before Signed out to Dr. Rubin PayorPickering with labs pending If improved and taking PO she can be discharged   Final Clinical Impressions(s) / ED Diagnoses   Final  diagnoses:  None    ED Discharge Orders    None       Zadie RhineWickline, Danyetta Gillham, MD 12/11/18 [phone]

## 2018-12-11 NOTE — ED Triage Notes (Signed)
Pt c/o vomiting starting yesterday. Pt also states she has diarrhea

## 2018-12-11 NOTE — Clinical Social Work Note (Signed)
Offered pt resources for alcohol and for linking with PCP.  Pt declined both.

## 2018-12-11 NOTE — ED Notes (Signed)
ED Provider at bedside. 

## 2018-12-11 NOTE — ED Provider Notes (Signed)
  Physical Exam  BP (!) 161/102 (BP Location: Right Arm)   Pulse 100   Temp 98 F (36.7 C)   Resp (!) 22   Ht 5\' 7"  (1.702 m)   Wt 63.5 kg   LMP 12/04/2018   SpO2 97%   BMI 21.93 kg/m   Physical Exam  ED Course/Procedures     Procedures  MDM  Received patient in signout.  Nausea and vomiting.  Continued to have vomiting.  Initial 12.5 mg of Phenergan given.  Continue vomiting.  Will give a second 12.5 mg.  Patient later feels somewhat better.  Tolerated some orals.  States she can go home because she can tell she is heading in the better direction.  Discharge.       Benjiman Core, MD 12/11/18 [phone]

## 2018-12-11 NOTE — ED Notes (Signed)
Pt given ginger ale. Tolerating well at this time. Pt instructed to take small, slow sips to avoid nausea.

## 2020-05-16 ENCOUNTER — Other Ambulatory Visit: Payer: Self-pay

## 2020-05-16 ENCOUNTER — Encounter (HOSPITAL_COMMUNITY): Payer: Self-pay | Admitting: Emergency Medicine

## 2020-05-16 ENCOUNTER — Emergency Department (HOSPITAL_COMMUNITY)
Admission: EM | Admit: 2020-05-16 | Discharge: 2020-05-16 | Disposition: A | Discharge status: Home / Self Care | Payer: Self-pay | Attending: Emergency Medicine | Admitting: Emergency Medicine

## 2020-05-16 DIAGNOSIS — L03116 Cellulitis of left lower limb: Secondary | ICD-10-CM | POA: Insufficient documentation

## 2020-05-16 MED ORDER — CELECOXIB 200 MG PO CAPS: 200.0000 mg | ORAL_CAPSULE | Freq: Two times a day (BID) | ORAL | 0 refills | Status: DC

## 2020-05-16 MED ORDER — DOXYCYCLINE HYCLATE 100 MG PO CAPS: 100.0000 mg | ORAL_CAPSULE | Freq: Two times a day (BID) | ORAL | 0 refills | Status: DC

## 2020-05-16 MED ORDER — LIDOCAINE-EPINEPHRINE (PF) 2 %-1:200000 IJ SOLN: 20.0000 mL | Freq: Once | INTRAMUSCULAR | Status: DC

## 2020-05-16 NOTE — ED Triage Notes (Signed)
Pt c/o abscess to LT knee since yesterday. States that it does have yellow pus draining from it. Redness noted to area. Denies fever.

## 2020-05-16 NOTE — Discharge Instructions (Addendum)

## 2020-05-16 NOTE — ED Provider Notes (Signed)
[facility] EMERGENCY DEPARTMENT Provider Note   CSN: [id number] Arrival date & time: 05/16/20  1059     History Chief Complaint  Patient presents with  . Abscess    [name] is a 34 y.o. female.   The history is provided by the patient and medical records. No language interpreter was used.  Abscess Location:  Leg Leg abscess location:  L knee Size:  4 Abscess quality: draining, induration, painful and redness   Red streaking: yes   Duration:  2 days Progression:  Worsening Pain details:    Quality:  Aching   Severity:  Moderate   Duration:  2 days   Timing:  Constant   Progression:  Improving Chronicity:  New Context: skin injury   Context: not diabetes, not immunosuppression, not injected drug use and not insect bite/sting   Worsened by:  Warm compresses and draining/squeezing Associated symptoms: no anorexia, no fatigue, no fever, no headaches, no nausea and no vomiting   Risk factors: prior abscess   Risk factors: no family hx of MRSA and no hx of MRSA        Past Medical History:  Diagnosis Date  . Bartholin's cyst     There are no problems to display for this patient.   History reviewed. No pertinent surgical history.   OB History   No obstetric history on file.     No family history on file.  Social History   Tobacco Use  . Smoking status: Never Smoker  . Smokeless tobacco: Never Used  Vaping Use  . Vaping Use: Never used  Substance Use Topics  . Alcohol use: Yes    Alcohol/week: 2.0 standard drinks    Types: 2 Glasses of wine per week  . Drug use: No    Home Medications Prior to Admission medications   Medication Sig Start Date End Date Taking? Authorizing Provider  levonorgestrel (MIRENA) 20 MCG/24HR IUD 1 each by Intrauterine route once.    [provider]  promethazine (PHENERGAN) 25 MG tablet Take 1 tablet (25 mg total) by mouth every 6 (six) hours as needed for nausea or vomiting. 12/11/18   Benjiman Core, MD     Allergies    Morphine and related  Review of Systems   Review of Systems  Constitutional: Negative for chills, fatigue and fever.  Gastrointestinal: Negative for anorexia, nausea and vomiting.  Musculoskeletal: Negative for myalgias.  Skin:       abscess  Neurological: Negative for headaches.    Physical Exam Updated Vital Signs BP 115/84 (BP Location: Right Arm)   Pulse 84   Temp 99.3 F (37.4 C) (Oral)   Resp 16   Ht 5\' 7"  (1.702 m)   Wt 63.5 kg   LMP 04/21/2020 (Approximate)   SpO2 98%   BMI 21.93 kg/m   Physical Exam Vitals and nursing note reviewed.  Constitutional:      General: She is not in acute distress.    Appearance: She is well-developed. She is not diaphoretic.  HENT:     Head: Normocephalic and atraumatic.  Eyes:     General: No scleral icterus.    Conjunctiva/sclera: Conjunctivae normal.  Cardiovascular:     Rate and Rhythm: Normal rate and regular rhythm.     Heart sounds: Normal heart sounds. No murmur heard.  No friction rub. No gallop.   Pulmonary:     Effort: Pulmonary effort is normal. No respiratory distress.     Breath sounds: Normal breath sounds.  Abdominal:     General: Bowel sounds are normal. There is no distension.     Palpations: Abdomen is soft. There is no mass.     Tenderness: There is no abdominal tenderness. There is no guarding.  Musculoskeletal:     Cervical back: Normal range of motion.     Comments: Area of induration and redness over the left lateral knee with small area of lymphangitis. There is swelling of the left leg > R leg with skin tenderness. Informal US shows cellulitis without fluid collection  Skin:    General: Skin is warm and dry.  Neurological:     Mental Status: She is alert and oriented to person, place, and time.  Psychiatric:        Behavior: Behavior normal.     ED Results / Procedures / Treatments   Labs (all labs ordered are listed, but only abnormal results are displayed) Labs Reviewed  - No data to display  EKG None  Radiology No results found.  Procedures Procedures (including critical care time)  Medications Ordered in ED Medications  lidocaine-EPINEPHrine (XYLOCAINE W/EPI) 2 %-1:100000 (with pres) injection 20 mL (has no administration in time range)    ED Course  I have reviewed the triage vital signs and the nursing notes.  Pertinent labs & imaging results that were available during my care of the patient were reviewed by me and considered in my medical decision making (see chart for details).    MDM Rules/Calculators/A&P                          Carena Stream presents with abscess and cellulitis of the left leg. The patient got into the bath tub today and applied pressure. She states that she was able to express a large amount of thick purulent discharge. There is no evidence of fluid colledtion on [place] and therefore no reason to I&D.  The presentation of Johnasia Liese is NOT consistent with necrotizing fascitis or osteomyolitis. There is no evidence of retained foreign body, neurovascular or tendon injury.  Low suspicion for DVT. The presentation of Nyashia Raney is NOT consistent with sepsis and/or bacteremia. Orvil Feil meets outpatient criteria for treatment and is sent home on empiric antibiotics covering the relevant bacteria.  Strict return and follow-up precautions have been given by me personally or by detailed written instructions verbalized by nursing staff using the teach back method to the patient/family/caregiver(s).  Data Reviewed/Counseling: I have reviewed the patient's vital signs, nursing notes, and other relevant tests/information. I had a detailed discussion regarding the historical points, exam findings, and any diagnostic results supporting the discharge diagnosis. I also discussed the need for outpatient follow-up and the need to return to the ED if symptoms worsen or if there are any questions or concerns that arise at home.  Final  Clinical Impression(s) / ED Diagnoses Final diagnoses:  Cellulitis of left lower extremity    Rx / DC Orders ED Discharge Orders    None       Arthor Captain, PA-C 05/16/20 1339    Jacalyn Lefevre, MD 05/16/20 1420

## 2020-07-26 IMAGING — CR DG SHOULDER 2+V*R*
1 series · 3 of 3 positions shown · non-contrast
Comparison: None.

CLINICAL DATA: 32 y/o  F; right shoulder pain after fall.

EXAM:
RIGHT SHOULDER - 2+ VIEW

[Series 1: dg shoulder right · 0.14mm/px · 3 of 3 slices shown]
[im 1/3]
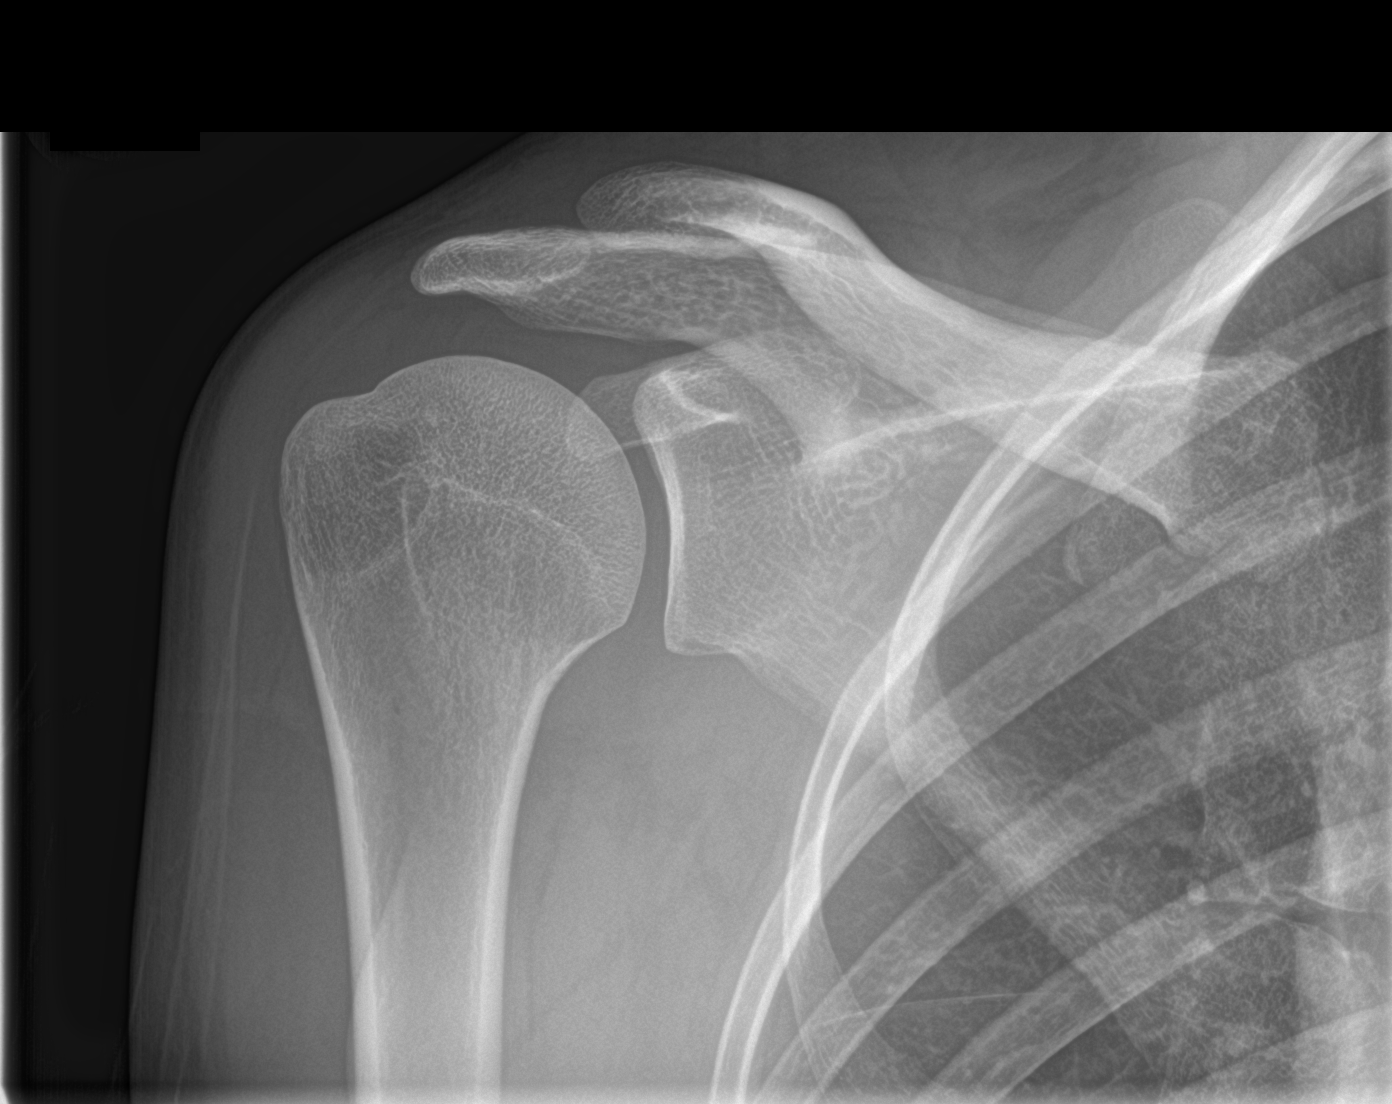
[im 2/3]
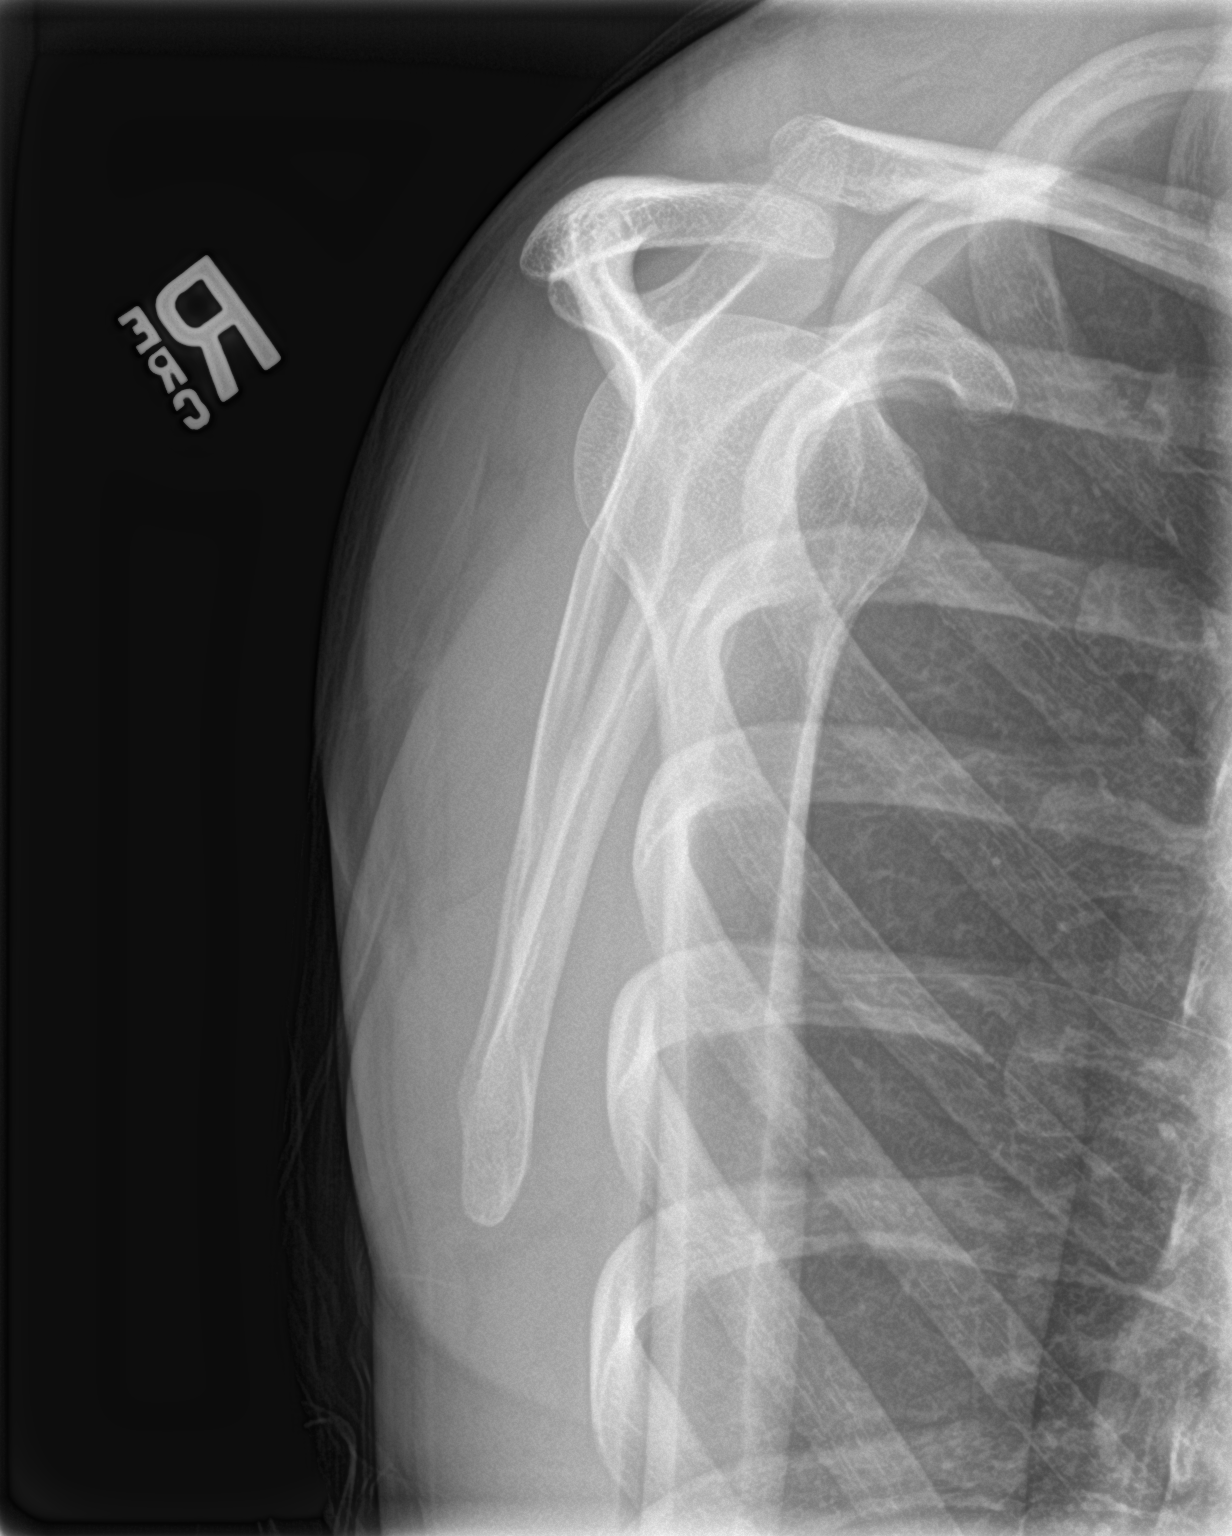
[im 3/3]
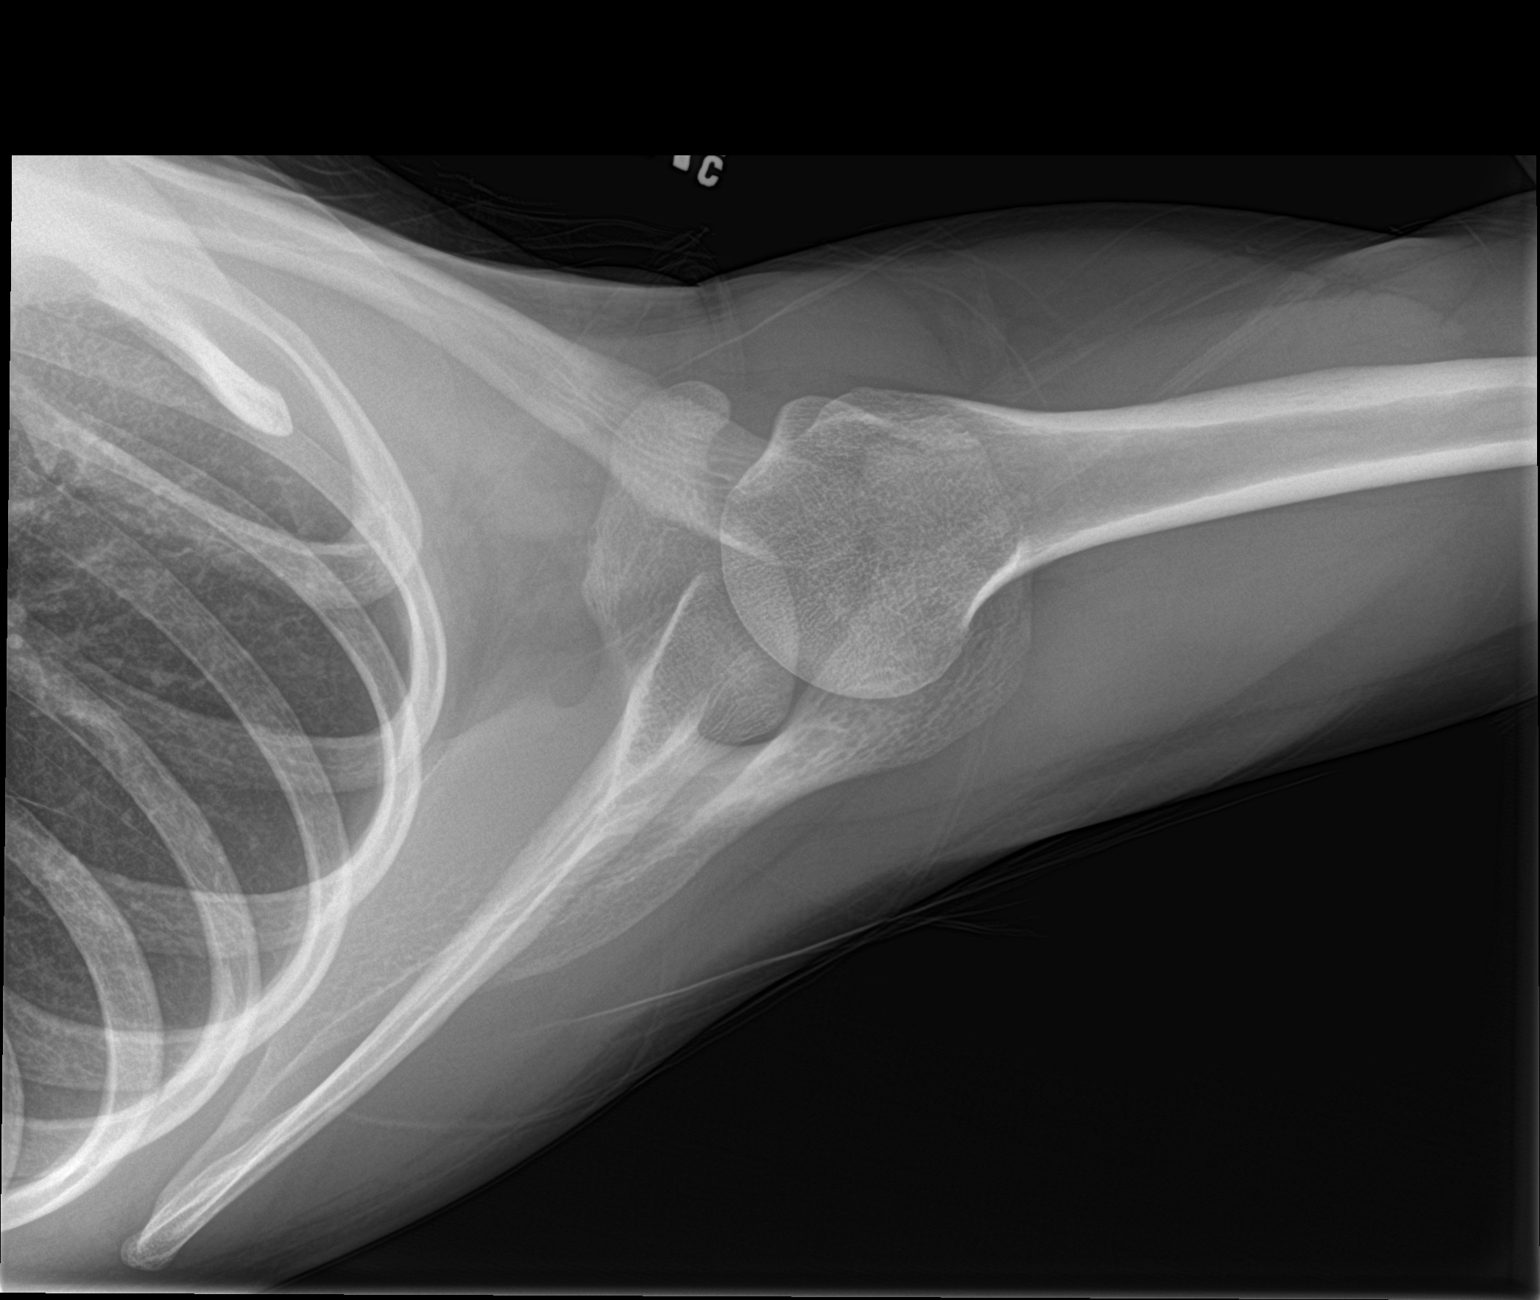

[3 of 3 positions shown; findings below may reference images not displayed]

FINDINGS: There is no evidence of fracture or dislocation. There is no
evidence of arthropathy or other focal bone abnormality. Soft
tissues are unremarkable.
IMPRESSION: Negative.

By: Sangyoun Ongkor M.D.
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
## 2021-04-17 ENCOUNTER — Encounter (HOSPITAL_COMMUNITY): Payer: Self-pay

## 2021-04-17 ENCOUNTER — Other Ambulatory Visit: Payer: Self-pay

## 2021-04-17 ENCOUNTER — Inpatient Hospital Stay (HOSPITAL_COMMUNITY)
Admission: EM | Admit: 2021-04-17 | Discharge: 2021-04-18 | DRG: 894 | Discharge status: Against Medical Advice | Payer: Self-pay | Attending: Family Medicine | Admitting: Family Medicine

## 2021-04-17 DIAGNOSIS — F10230 Alcohol dependence with withdrawal, uncomplicated: Principal | ICD-10-CM | POA: Diagnosis present

## 2021-04-17 DIAGNOSIS — F10239 Alcohol dependence with withdrawal, unspecified: Secondary | ICD-10-CM

## 2021-04-17 DIAGNOSIS — F10939 Alcohol use, unspecified with withdrawal, unspecified: Secondary | ICD-10-CM | POA: Diagnosis present

## 2021-04-17 DIAGNOSIS — Z20822 Contact with and (suspected) exposure to covid-19: Secondary | ICD-10-CM | POA: Diagnosis present

## 2021-04-17 DIAGNOSIS — Z5329 Procedure and treatment not carried out because of patient's decision for other reasons: Secondary | ICD-10-CM | POA: Diagnosis present

## 2021-04-17 DIAGNOSIS — Z79899 Other long term (current) drug therapy: Secondary | ICD-10-CM

## 2021-04-17 DIAGNOSIS — E876 Hypokalemia: Secondary | ICD-10-CM

## 2021-04-17 DIAGNOSIS — F10930 Alcohol use, unspecified with withdrawal, uncomplicated: Secondary | ICD-10-CM

## 2021-04-17 DIAGNOSIS — Z885 Allergy status to narcotic agent status: Secondary | ICD-10-CM

## 2021-04-17 LAB — RAPID URINE DRUG SCREEN, HOSP PERFORMED
Amphetamines: NOT DETECTED
Barbiturates: NOT DETECTED
Benzodiazepines: POSITIVE — AB
Cocaine: NOT DETECTED
Opiates: NOT DETECTED
Tetrahydrocannabinol: POSITIVE — AB

## 2021-04-17 LAB — COMPREHENSIVE METABOLIC PANEL
ALT: 16 U/L (ref 0–44)
AST: 27 U/L (ref 15–41)
Albumin: 4.8 g/dL (ref 3.5–5.0)
Alkaline Phosphatase: 64 U/L (ref 38–126)
Anion gap: 11 (ref 5–15)
BUN: 6 mg/dL (ref 6–20)
CO2: 24 mmol/L (ref 22–32)
Calcium: 9.2 mg/dL (ref 8.9–10.3)
Chloride: 105 mmol/L (ref 98–111)
Creatinine, Ser: 0.73 mg/dL (ref 0.44–1.00)
GFR, Estimated: >60 mL/min (ref >60)
Glucose, Bld: 103 mg/dL — ABNORMAL HIGH (ref 70–99)
Potassium: 3.3 mmol/L — ABNORMAL LOW (ref 3.5–5.1)
Sodium: 140 mmol/L (ref 135–145)
Total Bilirubin: 0.8 mg/dL (ref 0.3–1.2)
Total Protein: 7.8 g/dL (ref 6.5–8.1)

## 2021-04-17 LAB — CBC WITH DIFFERENTIAL/PLATELET
Abs Immature Granulocytes: 0.03 10*3/uL (ref 0.00–0.07)
Basophils Absolute: 0 10*3/uL (ref 0.0–0.1)
Basophils Relative: 0 %
Eosinophils Absolute: 0.1 10*3/uL (ref 0.0–0.5)
Eosinophils Relative: 2 %
HCT: 41.1 % (ref 36.0–46.0)
Hemoglobin: 13.4 g/dL (ref 12.0–15.0)
Immature Granulocytes: 0 %
Lymphocytes Relative: 10 %
Lymphs Abs: 0.8 10*3/uL (ref 0.7–4.0)
MCH: 32 pg (ref 26.0–34.0)
MCHC: 32.6 g/dL (ref 30.0–36.0)
MCV: 98.1 fL (ref 80.0–100.0)
Monocytes Absolute: 0.4 10*3/uL (ref 0.1–1.0)
Monocytes Relative: 5 %
Neutro Abs: 6.7 10*3/uL (ref 1.7–7.7)
Neutrophils Relative %: 83 %
Platelets: 174 10*3/uL (ref 150–400)
RBC: 4.19 MIL/uL (ref 3.87–5.11)
RDW: 14.1 % (ref 11.5–15.5)
WBC: 8.1 10*3/uL (ref 4.0–10.5)
nRBC: 0 % (ref 0.0–0.2)

## 2021-04-17 LAB — I-STAT CHEM 8, ED
BUN: 4 mg/dL — ABNORMAL LOW (ref 6–20)
Calcium, Ion: 1 mmol/L — ABNORMAL LOW (ref 1.15–1.40)
Chloride: 106 mmol/L (ref 98–111)
Creatinine, Ser: 0.6 mg/dL (ref 0.44–1.00)
Glucose, Bld: 103 mg/dL — ABNORMAL HIGH (ref 70–99)
HCT: 29 % — ABNORMAL LOW (ref 36.0–46.0)
Hemoglobin: 9.9 g/dL — ABNORMAL LOW (ref 12.0–15.0)
Potassium: 4.2 mmol/L (ref 3.5–5.1)
Sodium: 138 mmol/L (ref 135–145)
TCO2: 25 mmol/L (ref 22–32)

## 2021-04-17 LAB — URINALYSIS, ROUTINE W REFLEX MICROSCOPIC
Bacteria, UA: NONE SEEN
Bilirubin Urine: NEGATIVE
Glucose, UA: NEGATIVE mg/dL
Ketones, ur: 20 mg/dL — AB
Leukocytes,Ua: NEGATIVE
Nitrite: NEGATIVE
Protein, ur: NEGATIVE mg/dL
Specific Gravity, Urine: 1.008 (ref 1.005–1.030)
pH: 7 (ref 5.0–8.0)

## 2021-04-17 LAB — PROTIME-INR
INR: 0.9 (ref 0.8–1.2)
Prothrombin Time: 12.4 seconds (ref 11.4–15.2)

## 2021-04-17 LAB — ACETAMINOPHEN LEVEL: Acetaminophen (Tylenol), Serum: <10 ug/mL — ABNORMAL LOW (ref 10–30)

## 2021-04-17 LAB — I-STAT BETA HCG BLOOD, ED (MC, WL, AP ONLY): I-stat hCG, quantitative: <5 m[IU]/mL (ref <5)

## 2021-04-17 LAB — ETHANOL: Alcohol, Ethyl (B): <10 mg/dL (ref <10)

## 2021-04-17 LAB — SALICYLATE LEVEL: Salicylate Lvl: <7 mg/dL — ABNORMAL LOW (ref 7.0–30.0)

## 2021-04-17 LAB — LIPASE, BLOOD: Lipase: 40 U/L (ref 11–51)

## 2021-04-17 MED ORDER — LORAZEPAM 2 MG/ML IJ SOLN: 0.0000 mg | Freq: Two times a day (BID) | INTRAMUSCULAR | Status: DC

## 2021-04-17 MED ORDER — POTASSIUM CHLORIDE 10 MEQ/100ML IV SOLN
10.0000 meq | Freq: Once | INTRAVENOUS | Status: AC
  Administered 2021-04-17: 10 meq via INTRAVENOUS
  Filled 2021-04-17: qty 100

## 2021-04-17 MED ORDER — THIAMINE HCL 100 MG PO TABS: 100.0000 mg | ORAL_TABLET | Freq: Every day | ORAL | Status: DC

## 2021-04-17 MED ORDER — LORAZEPAM 1 MG PO TABS: 0.0000 mg | ORAL_TABLET | Freq: Two times a day (BID) | ORAL | Status: DC

## 2021-04-17 MED ORDER — LORAZEPAM 2 MG/ML IJ SOLN
0.0000 mg | Freq: Four times a day (QID) | INTRAMUSCULAR | Status: DC
  Administered 2021-04-17 (×2): 2 mg via INTRAVENOUS
  Filled 2021-04-17 (×2): qty 1

## 2021-04-17 MED ORDER — DICYCLOMINE HCL 10 MG/ML IM SOLN
20.0000 mg | Freq: Once | INTRAMUSCULAR | Status: AC
  Administered 2021-04-17: 20 mg via INTRAMUSCULAR
  Filled 2021-04-17: qty 2

## 2021-04-17 MED ORDER — LORAZEPAM 1 MG PO TABS: 0.0000 mg | ORAL_TABLET | Freq: Four times a day (QID) | ORAL | Status: DC

## 2021-04-17 MED ORDER — ONDANSETRON HCL 4 MG/2ML IJ SOLN
4.0000 mg | Freq: Once | INTRAMUSCULAR | Status: AC
  Administered 2021-04-17: 4 mg via INTRAVENOUS
  Filled 2021-04-17: qty 2

## 2021-04-17 MED ORDER — SODIUM CHLORIDE 0.9 % IV BOLUS
1000.0000 mL | Freq: Once | INTRAVENOUS | Status: AC
Start: 2021-04-17 — End: 2021-04-17
  Administered 2021-04-17: 1000 mL via INTRAVENOUS

## 2021-04-17 MED ORDER — THIAMINE HCL 100 MG/ML IJ SOLN
100.0000 mg | Freq: Every day | INTRAMUSCULAR | Status: DC
Start: 2021-04-17 — End: 2021-04-18
  Administered 2021-04-17: 100 mg via INTRAVENOUS
  Filled 2021-04-17: qty 2

## 2021-04-17 NOTE — ED Notes (Signed)
Patient unable to keep down PO fluids. PA made aware.

## 2021-04-17 NOTE — ED Provider Notes (Cosign Needed)
[facility]-EMERGENCY DEPT Provider Note   CSN: [id number] Arrival date & time: 04/17/21  1411     History Chief Complaint  Patient presents with   Withdrawal    [name] is a 35 y.o. female who presents to the ED today via EMS with complaint of alcohol withdrawal. Pt reports she last drank alcohol about 3 days ago - she states she drinks malt liquor. She states she began vomiting earlier today and attempted to drink to try and help with withdrawals without relief. Pt states she has been in withdrawal in the past and this feels similar. She was given Zofran with EMS without relief. She complains of abdominal cramping and diarrhea as well. History is somewhat limited as pt is actively vomiting in the room.   The history is provided by the patient and medical records.      Past Medical History:  Diagnosis Date   Bartholin's cyst     There are no problems to display for this patient.   History reviewed. No pertinent surgical history.   OB History   No obstetric history on file.     History reviewed. No pertinent family history.  Social History   Tobacco Use   Smoking status: Never   Smokeless tobacco: Never  Vaping Use   Vaping Use: Never used  Substance Use Topics   Alcohol use: Yes    Alcohol/week: 2.0 standard drinks    Types: 2 Glasses of wine per week   Drug use: No    Home Medications Prior to Admission medications   Medication Sig Start Date End Date Taking? Authorizing Provider  celecoxib (CELEBREX) 200 MG capsule Take 1 capsule (200 mg total) by mouth 2 (two) times daily. 05/16/20   Arthor Captain, PA-C  doxycycline (VIBRAMYCIN) 100 MG capsule Take 1 capsule (100 mg total) by mouth 2 (two) times daily. One po bid x 7 days 05/16/20   Arthor Captain, PA-C  levonorgestrel (MIRENA) 20 MCG/24HR IUD 1 each by Intrauterine route once.    [provider]  promethazine (PHENERGAN) 25 MG tablet Take 1 tablet (25 mg total) by mouth  every 6 (six) hours as needed for nausea or vomiting. 12/11/18   Benjiman Core, MD    Allergies    Morphine and related  Review of Systems   Review of Systems  Unable to perform ROS: Acuity of condition  Constitutional:  Negative for chills and fever.  Gastrointestinal:  Positive for abdominal pain, diarrhea, nausea and vomiting.   Physical Exam Updated Vital Signs BP (!) 167/119   Pulse (!) 111   Temp 98.2 F (36.8 C) (Oral)   Resp 20   SpO2 100%   Physical Exam Vitals and nursing note reviewed.  Constitutional:      Appearance: She is not ill-appearing or diaphoretic.     Comments: Actively vomiting in the room. Tremulous.   HENT:     Head: Normocephalic and atraumatic.  Eyes:     Conjunctiva/sclera: Conjunctivae normal.  Cardiovascular:     Rate and Rhythm: Regular rhythm. Tachycardia present.     Pulses: Normal pulses.  Pulmonary:     Effort: Pulmonary effort is normal.     Breath sounds: Normal breath sounds. No wheezing, rhonchi or rales.  Abdominal:     Palpations: Abdomen is soft.     Tenderness: There is no abdominal tenderness.  Musculoskeletal:     Cervical back: Neck supple.  Skin:    General: Skin is warm and dry.  Neurological:     Mental Status: She is alert.    ED Results / Procedures / Treatments   Labs (all labs ordered are listed, but only abnormal results are displayed) Labs Reviewed  URINALYSIS, ROUTINE W REFLEX MICROSCOPIC - Abnormal; Notable for the following components:      Result Value   Color, Urine STRAW (*)    Hgb urine dipstick SMALL (*)    Ketones, ur 20 (*)    All other components within normal limits  RAPID URINE DRUG SCREEN, HOSP PERFORMED - Abnormal; Notable for the following components:   Benzodiazepines POSITIVE (*)    Tetrahydrocannabinol POSITIVE (*)    All other components within normal limits  COMPREHENSIVE METABOLIC PANEL - Abnormal; Notable for the following components:   Potassium 3.3 (*)    Glucose, Bld 103  (*)    All other components within normal limits  ACETAMINOPHEN LEVEL - Abnormal; Notable for the following components:   Acetaminophen (Tylenol), Serum <10 (*)    All other components within normal limits  SALICYLATE LEVEL - Abnormal; Notable for the following components:   Salicylate Lvl <7.0 (*)    All other components within normal limits  I-STAT CHEM 8, ED - Abnormal; Notable for the following components:   BUN 4 (*)    Glucose, Bld 103 (*)    Calcium, Ion 1.00 (*)    Hemoglobin 9.9 (*)    HCT 29.0 (*)    All other components within normal limits  SARS CORONAVIRUS 2 (TAT 6-24 HRS)  LIPASE, BLOOD  CBC WITH DIFFERENTIAL/PLATELET  PROTIME-INR  ETHANOL  I-STAT BETA HCG BLOOD, ED ([facility], [facility], [facility] ONLY)    EKG EKG Interpretation  Date/Time:  Sunday April 17 2021 15:15:52 EDT Ventricular Rate:  112 PR Interval:  169 QRS Duration: 77 QT Interval:  329 QTC Calculation: 449 R Axis:   92 Text Interpretation: Sinus tachycardia Probable left atrial enlargement Anterior infarct, old No significant change since last tracing Confirmed by Sheldon, Charles ([id number]) on 04/17/2021 3:32:31 PM  Radiology No results found.  Procedures Procedures   Medications Ordered in ED Medications  LORazepam (ATIVAN) injection 0-4 mg (2 mg Intravenous Given 04/17/21 2241)    Or  LORazepam (ATIVAN) tablet 0-4 mg ( Oral See Alternative 04/17/21 2241)  LORazepam (ATIVAN) injection 0-4 mg (has no administration in time range)    Or  LORazepam (ATIVAN) tablet 0-4 mg (has no administration in time range)  thiamine tablet 100 mg ( Oral See Alternative 04/17/21 1512)    Or  thiamine (B-1) injection 100 mg (100 mg Intravenous Given 04/17/21 1512)  sodium chloride 0.9 % bolus 1,000 mL (0 mLs Intravenous Stopped 04/17/21 1836)  potassium chloride 10 mEq in 100 mL IVPB (0 mEq Intravenous Stopped 04/17/21 2013)  ondansetron (ZOFRAN) injection 4 mg (4 mg Intravenous Given 04/17/21 1855)  dicyclomine (BENTYL) injection  20 mg (20 mg Intramuscular Given 04/17/21 1855)    ED Course  I have reviewed the triage vital signs and the nursing notes.  Pertinent labs & imaging results that were available during my care of the patient were reviewed by me and considered in my medical decision making (see chart for details).    MDM Rules/Calculators/A&P                           35  year old female who presents to the ED today for alcohol withdrawal.  She is actively vomiting on arrival despite Zofran by EMS.  On arrival she is tachycardic in the 120s.  Blood pressure elevated 170/118 with repeat 167/119.  She is afebrile.  She is tremulous on exam, does appear to be withdrawing at this time.  CIWA score of 12.  Started on CIWA protocol today.  Provided with Ativan, thiamine, fluids.  We will plan for lab work and reassessment.  History is somewhat limited as patient is actively vomiting in the room today.  I am unable to go through full history, will plan to reevaluate.   On reevaluation after 2 mg IV ativan pt is sleeping soundly. HR has improved and is no longer tachy. Will continue to monitor.   CBC without leukocytosis and hgb stable at 13.4 CMP with potassium 3.3; will replete. Glucose 103. No other electrolyte abnormalities. LFTs unremarkable.  Lipase 40.  Beta hcg negative EtOH, Tylenol, and Aspirin levels WNL  Once patient awoke she began feeling slightly nauseated, zofran provided. She has not urinated for [place] yet. Pending urine.   Repeat CIWA at 12. Additional ativan provided. Still vomiting. Pt does appear uncomfortable and continues to be somewhat tachycardic. Will admit for alcohol withdrawal.   Discussed case with Dr. Rachael Darby who agrees to accept patient for admission.   This note was prepared using Dragon voice recognition software and may include unintentional dictation errors due to the inherent limitations of voice recognition software.   Final Clinical Impression(s) / ED Diagnoses Final  diagnoses:  Alcohol withdrawal syndrome without complication [facility])    Rx / DC Orders ED Discharge Orders     None        Tanda Rockers, PA-C 04/17/21 2251

## 2021-04-17 NOTE — ED Triage Notes (Signed)
Pt BIB EMS from home. Pt is withdrawing from alcohol. Pt endorses severe N/V.   20G LW 4mg  Zofran

## 2021-04-17 NOTE — ED Provider Notes (Signed)
Emergency Medicine Provider Triage Evaluation Note  [name] , a 35 y.o. female  was evaluated in triage.  Pt complains of alcohol withdrawal.  She reports that her last drink was three days ago.  She did have a drink this morning however to try and help with withdrawals.  Given zofran by EMS, continues to vomit.  She denies any fevers. No history of seizures from withdrawal.  Denies SI, HI, AVH.   Review of Systems  Positive: Vomiting, shaking Negative: seizures  Physical Exam  There were no vitals taken for this visit. Gen:   Awake, actively vomiting and dry heaving Resp:  Normal effort  MSK:   Moves extremities without difficulty  Other:  Appears unwell.   Medical Decision Making  Medically screening exam initiated at 2:21 PM.  Appropriate orders placed.  Defne Savell was informed that the remainder of the evaluation will be completed by another provider, this initial triage assessment does not replace that evaluation, and the importance of remaining in the ED until their evaluation is complete.  Note: Portions of this report may have been transcribed using voice recognition software. Every effort was made to ensure accuracy; however, inadvertent computerized transcription errors may be present    Cristina Gong, PA-C 04/17/21 1427    Sloan Leiter, DO 04/18/21 0113

## 2021-04-18 LAB — SARS CORONAVIRUS 2 (TAT 6-24 HRS): SARS Coronavirus 2: NEGATIVE

## 2021-04-18 NOTE — ED Notes (Addendum)
Patient still expressing desire to leave. This RN and patient discussed risks of leaving at length. This RN answered patient's questions, and patient verbalized understanding. Patient signed AMA form, IV removed.

## 2021-04-18 NOTE — H&P (Signed)
History and Physical    [name] ZOX:[id number] DOB: 29-Nov-1985 DOA: 04/17/2021  PCP: Patient, No Pcp Per (Inactive)   Patient coming from: Home  Chief Complaint: Alcohol withdrawal.  HPI: [name] is a 35 y.o. female with medical history significant for alcohol abuse and presents by EMS with symptoms of withdrawal.  She reports she has been a heavy alcohol drinker for a number of years and has been in withdrawals before.  She normally drinks beer and malt liquor.  She states that she recently went to rehab in [name] spent 2 or 3 days there but was discharged on Friday.  She is very unclear about the timeline of the events and what actually occurred.  She jumps from topic to topic very quickly and is scattered and providing history of events over the last few days.  She does state that she visited a friend yesterday and she drank a couple of beers.  This morning she started to vomit and had some abdominal cramping and diarrhea.  She states this felt very similar to when she was in withdrawals in the past.  She then started to get tremulous and would see hallucinations of faces floating around the room and color swirling.  She has continued to have vomiting and nausea in the emergency room.  She has been given Ativan in the emergency room.   ED Course: Ms. Kovacic has been hemodynamically stable in the emergency room with elevated blood pressures and intermittent tachycardia.  She had a decreased potassium level at 3.3 and was given supplemental potassium by IV and potassium level increased to 4.2.  Sodium 140 chloride 105 bicarb 24 creatinine 0.73 BUN 6 lipase 40 alkaline phosphatase 64 AST 27 ALT 16.  CBC is unremarkable.  INR 0.9.  Acetaminophen level less than 10 salicylate level less than 7.  Beta-hCG negative.  COVID swab is negative.  Hospitalist service has been asked admit for further management  Review of Systems:  General: Denies fever, chills, weight loss, night  sweats. Denies dizziness.  HENT: Denies head trauma, headache, denies change in hearing, tinnitus.  Denies nasal congestion.  Denies sore throat Eyes: Denies blurry vision, pain in eye, drainage. Denies discoloration of eyes. Neck: Denies pain.  Denies swelling.  Denies pain with movement. Cardiovascular: Denies chest pain, palpitations.  Denies edema.  Denies orthopnea Respiratory: Denies shortness of breath, cough. Denies wheezing.  Denies sputum production Gastrointestinal: Reports abdominal pain, nausea, vomiting, diarrhea. Denies melena. Denies hematemesis. Musculoskeletal: Denies limitation of movement.  Denies deformity or swelling. Denies arthralgias or myalgias. Genitourinary: Denies pelvic pain.  Denies urinary frequency or hesitancy. Denies dysuria.  Skin: Denies rash.  Denies petechiae, purpura, ecchymosis. Neurological: Denies syncope. Denies seizure activity. Denies paresthesia.  Denies slurred speech, drooping face Psychiatric: Denies depression, anxiety. Reports visual hallucinations.  Past Medical History:  Diagnosis Date   Bartholin's cyst     History reviewed. No pertinent surgical history.  Social History  reports that she has never smoked. She has never used smokeless tobacco. She reports current alcohol use of about 2.0 standard drinks per week. She reports that she does not use drugs.  Allergies  Allergen Reactions   Morphine And Related Swelling    Arm swelling     History reviewed. No pertinent family history.   Prior to Admission medications   Medication Sig Start Date End Date Taking? Authorizing Provider  levonorgestrel (MIRENA) 20 MCG/24HR IUD 1 each by Intrauterine route once.   Yes [provider]  celecoxib (CELEBREX) 200 MG capsule Take 1 capsule (200 mg total) by mouth 2 (two) times daily. Patient not taking: Reported on 04/17/2021 05/16/20   Arthor Captain, PA-C  doxycycline (VIBRAMYCIN) 100 MG capsule Take 1 capsule (100 mg total) by  mouth 2 (two) times daily. One po bid x 7 days Patient not taking: Reported on 04/17/2021 05/16/20   Arthor Captain, PA-C  promethazine (PHENERGAN) 25 MG tablet Take 1 tablet (25 mg total) by mouth every 6 (six) hours as needed for nausea or vomiting. Patient not taking: Reported on 04/17/2021 12/11/18   Benjiman Core, MD    Physical Exam: Vitals:   04/17/21 2130 04/17/21 2200 04/17/21 2230 04/17/21 2233  BP: (!) 158/100 (!) 147/112 (!) 152/112 (!) 152/112  Pulse: (!) 114 (!) 103 99 99  Resp: 17 (!) 25 (!) 23   Temp:      TempSrc:      SpO2: 98% 95% 96%     Constitutional: NAD, calm, comfortable Vitals:   04/17/21 2130 04/17/21 2200 04/17/21 2230 04/17/21 2233  BP: (!) 158/100 (!) 147/112 (!) 152/112 (!) 152/112  Pulse: (!) 114 (!) 103 99 99  Resp: 17 (!) 25 (!) 23   Temp:      TempSrc:      SpO2: 98% 95% 96%    General: WDWN, Alert and oriented to person and place. Pt is unsure of date or year and cannot articulate events of past few days well.  Eyes: EOMI, PERRL, conjunctivae normal.  Sclera nonicteric HENT:  [place]/AT, external ears normal.  Nares patent without epistasis.  Mucous membranes are dry. Posterior pharynx clear  Neck: Soft, normal range of motion, supple, no masses, Trachea midline Respiratory: clear to auscultation bilaterally, no wheezing, no crackles. Normal respiratory effort. No accessory muscle use.  Cardiovascular: Regular rate and rhythm, no murmurs / rubs / gallops. No extremity edema.  Abdomen: Soft, no tenderness, nondistended, no rebound or guarding.  No masses palpated. Bowel sounds normoactive Musculoskeletal: FROM. no cyanosis. No joint deformity upper and lower extremities. Normal muscle tone.  Skin: Warm, dry, intact no rashes, lesions, ulcers. No induration Neurologic: CN 2-12 grossly intact. Normal speech. Sensation intact to touch, patella DTR +1 bilaterally. Strength 5/5 in all extremities. Mild tremors. No drift. Normal extension of tongue without  deviation Psychiatric: Normal mood. Admits to having some hallucinations and seeing faces floating in room.   Labs on Admission: I have personally reviewed following labs and imaging studies  CBC: Recent Labs  Lab 04/17/21 1457 04/17/21 1519  WBC 8.1  --   NEUTROABS 6.7  --   HGB 13.4 9.9*  HCT 41.1 29.0*  MCV 98.1  --   PLT 174  --     Basic Metabolic Panel: Recent Labs  Lab 04/17/21 1457 04/17/21 1519  NA 140 138  K 3.3* 4.2  CL 105 106  CO2 24  --   GLUCOSE 103* 103*  BUN 6 4*  CREATININE 0.73 0.60  CALCIUM 9.2  --     GFR: CrCl cannot be calculated (Unknown ideal weight.).  Liver Function Tests: Recent Labs  Lab 04/17/21 1457  AST 27  ALT 16  ALKPHOS 64  BILITOT 0.8  PROT 7.8  ALBUMIN 4.8    Urine analysis:    Component Value Date/Time   COLORURINE STRAW (A) 04/17/2021 2150   APPEARANCEUR CLEAR 04/17/2021 2150   LABSPEC 1.008 04/17/2021 2150   PHURINE 7.0 04/17/2021 2150   GLUCOSEU NEGATIVE 04/17/2021 2150   HGBUR SMALL (  A) 04/17/2021 2150   BILIRUBINUR NEGATIVE 04/17/2021 2150   KETONESUR 20 (A) 04/17/2021 2150   PROTEINUR NEGATIVE 04/17/2021 2150   UROBILINOGEN 0.2 03/02/2009 1737   NITRITE NEGATIVE 04/17/2021 2150   LEUKOCYTESUR NEGATIVE 04/17/2021 2150    Radiological Exams on Admission: No results found.  EKG: Independently reviewed.  EKG shows sinus tachycardia with no acute ST elevation or depression.  QTc 449  Assessment/Plan Principal Problem:   Alcohol withdrawal  Ms. Benegas is admitted to telemetry floor.  Placed on CIWA protocol and ativan will be given for elevated CIWA scores.  Start librium.  MVI, Folic acid and thiamine daily.  Seizure and fall precautions.  IVF hydration.  Consult case management to evaluate and assist with rehab needs  Active Problems:   Hypokalemia Had potassium replaced in ER. Potassium level increased from 3.3 to 4.2.  Check magnesium level. Recheck electrolytes, renal function and LFTs in  am    DVT prophylaxis: Lovenox for DVT prophylaxis  Code Status:   Full Code  Family Communication:  Diagnosis and plan discussed with patient.  Further recommendations to follow as clinically indicated Disposition Plan:   Patient is from:  Home  Anticipated DC to:  Home versus inpatient rehab, to be determined  Anticipated DC date:  Dissipate 2 midnight or more stay in the hospital to treat acute condition  Admission status:  Inpatient  Claudean Severance Neco Kling MD [facility]  How to contact the [facility] Attending or Consulting provider 7A - 7P or covering provider during after hours 7P -7A, for this patient?   Check the care team in [facility] and look for a) attending/consulting [facility] provider listed and b) the [facility] team listed Log into [email or web address] and use 's universal password to access. If you do not have the password, please contact the hospital operator. Locate the [facility] provider you are looking for under [facility] and page to a number that you can be directly reached. If you still have difficulty reaching the provider, please page the [facility] (Director on Call) for the Hospitalists listed on amion for assistance.  04/18/2021, 12:00 AM

## 2021-04-18 NOTE — ED Notes (Signed)
Patient called out requesting to leave stating that she is at baseline and that we cannot keep her here, and that she has somewhere she wants to be in the morning so she would like to leave. Chotiner, MD made aware. MD speaking with patient at this time.

## 2021-04-19 ENCOUNTER — Inpatient Hospital Stay (HOSPITAL_COMMUNITY)
Admission: EM | Admit: 2021-04-19 | Discharge: 2021-04-21 | DRG: 897 | Disposition: A | Discharge status: Home / Self Care | Payer: Self-pay | Attending: Internal Medicine | Admitting: Internal Medicine

## 2021-04-19 ENCOUNTER — Other Ambulatory Visit: Payer: Self-pay

## 2021-04-19 ENCOUNTER — Encounter (HOSPITAL_COMMUNITY): Payer: Self-pay | Admitting: Emergency Medicine

## 2021-04-19 DIAGNOSIS — F10939 Alcohol use, unspecified with withdrawal, unspecified: Secondary | ICD-10-CM | POA: Diagnosis present

## 2021-04-19 DIAGNOSIS — R112 Nausea with vomiting, unspecified: Secondary | ICD-10-CM

## 2021-04-19 DIAGNOSIS — Y904 Blood alcohol level of 80-99 mg/100 ml: Secondary | ICD-10-CM | POA: Diagnosis present

## 2021-04-19 DIAGNOSIS — F1910 Other psychoactive substance abuse, uncomplicated: Secondary | ICD-10-CM

## 2021-04-19 DIAGNOSIS — Z885 Allergy status to narcotic agent status: Secondary | ICD-10-CM

## 2021-04-19 DIAGNOSIS — E876 Hypokalemia: Secondary | ICD-10-CM | POA: Diagnosis present

## 2021-04-19 DIAGNOSIS — K2920 Alcoholic gastritis without bleeding: Secondary | ICD-10-CM

## 2021-04-19 DIAGNOSIS — E860 Dehydration: Secondary | ICD-10-CM | POA: Diagnosis present

## 2021-04-19 DIAGNOSIS — F10230 Alcohol dependence with withdrawal, uncomplicated: Secondary | ICD-10-CM

## 2021-04-19 DIAGNOSIS — K2970 Gastritis, unspecified, without bleeding: Secondary | ICD-10-CM | POA: Diagnosis present

## 2021-04-19 DIAGNOSIS — Z79899 Other long term (current) drug therapy: Secondary | ICD-10-CM

## 2021-04-19 DIAGNOSIS — K219 Gastro-esophageal reflux disease without esophagitis: Secondary | ICD-10-CM | POA: Diagnosis present

## 2021-04-19 DIAGNOSIS — F10239 Alcohol dependence with withdrawal, unspecified: Principal | ICD-10-CM | POA: Diagnosis present

## 2021-04-19 DIAGNOSIS — Z20822 Contact with and (suspected) exposure to covid-19: Secondary | ICD-10-CM | POA: Diagnosis present

## 2021-04-19 LAB — URINALYSIS, ROUTINE W REFLEX MICROSCOPIC
Bacteria, UA: NONE SEEN
Bilirubin Urine: NEGATIVE
Glucose, UA: NEGATIVE mg/dL
Ketones, ur: 20 mg/dL — AB
Leukocytes,Ua: NEGATIVE
Nitrite: NEGATIVE
Protein, ur: NEGATIVE mg/dL
Specific Gravity, Urine: 1.008 (ref 1.005–1.030)
pH: 7 (ref 5.0–8.0)

## 2021-04-19 LAB — CBC WITH DIFFERENTIAL/PLATELET
Abs Immature Granulocytes: 0.01 10*3/uL (ref 0.00–0.07)
Basophils Absolute: 0 10*3/uL (ref 0.0–0.1)
Basophils Relative: 1 %
Eosinophils Absolute: 0.1 10*3/uL (ref 0.0–0.5)
Eosinophils Relative: 1 %
HCT: 41.8 % (ref 36.0–46.0)
Hemoglobin: 13.9 g/dL (ref 12.0–15.0)
Immature Granulocytes: 0 %
Lymphocytes Relative: 16 %
Lymphs Abs: 1 10*3/uL (ref 0.7–4.0)
MCH: 32.2 pg (ref 26.0–34.0)
MCHC: 33.3 g/dL (ref 30.0–36.0)
MCV: 96.8 fL (ref 80.0–100.0)
Monocytes Absolute: 0.4 10*3/uL (ref 0.1–1.0)
Monocytes Relative: 7 %
Neutro Abs: 4.8 10*3/uL (ref 1.7–7.7)
Neutrophils Relative %: 75 %
Platelets: 177 10*3/uL (ref 150–400)
RBC: 4.32 MIL/uL (ref 3.87–5.11)
RDW: 14.1 % (ref 11.5–15.5)
WBC: 6.3 10*3/uL (ref 4.0–10.5)
nRBC: 0 % (ref 0.0–0.2)

## 2021-04-19 LAB — RESP PANEL BY RT-PCR (FLU A&B, COVID) ARPGX2
Influenza A by PCR: NEGATIVE
Influenza B by PCR: NEGATIVE
SARS Coronavirus 2 by RT PCR: NEGATIVE

## 2021-04-19 LAB — PHOSPHORUS: Phosphorus: 4.1 mg/dL (ref 2.5–4.6)

## 2021-04-19 LAB — COMPREHENSIVE METABOLIC PANEL
ALT: 22 U/L (ref 0–44)
AST: 38 U/L (ref 15–41)
Albumin: 4.1 g/dL (ref 3.5–5.0)
Alkaline Phosphatase: 58 U/L (ref 38–126)
Anion gap: 7 (ref 5–15)
BUN: 7 mg/dL (ref 6–20)
CO2: 24 mmol/L (ref 22–32)
Calcium: 7.5 mg/dL — ABNORMAL LOW (ref 8.9–10.3)
Chloride: 107 mmol/L (ref 98–111)
Creatinine, Ser: 0.58 mg/dL (ref 0.44–1.00)
GFR, Estimated: >60 mL/min (ref >60)
Glucose, Bld: 113 mg/dL — ABNORMAL HIGH (ref 70–99)
Potassium: 2.9 mmol/L — ABNORMAL LOW (ref 3.5–5.1)
Sodium: 138 mmol/L (ref 135–145)
Total Bilirubin: 0.6 mg/dL (ref 0.3–1.2)
Total Protein: 7 g/dL (ref 6.5–8.1)

## 2021-04-19 LAB — RAPID URINE DRUG SCREEN, HOSP PERFORMED
Amphetamines: NOT DETECTED
Barbiturates: NOT DETECTED
Benzodiazepines: POSITIVE — AB
Cocaine: NOT DETECTED
Opiates: NOT DETECTED
Tetrahydrocannabinol: POSITIVE — AB

## 2021-04-19 LAB — SALICYLATE LEVEL: Salicylate Lvl: <7 mg/dL — ABNORMAL LOW (ref 7.0–30.0)

## 2021-04-19 LAB — ETHANOL: Alcohol, Ethyl (B): 84 mg/dL — ABNORMAL HIGH (ref <10)

## 2021-04-19 LAB — ACETAMINOPHEN LEVEL: Acetaminophen (Tylenol), Serum: <10 ug/mL — ABNORMAL LOW (ref 10–30)

## 2021-04-19 LAB — MAGNESIUM: Magnesium: 1.7 mg/dL (ref 1.7–2.4)

## 2021-04-19 LAB — HIV ANTIBODY (ROUTINE TESTING W REFLEX): HIV Screen 4th Generation wRfx: NONREACTIVE

## 2021-04-19 MED ORDER — PANTOPRAZOLE SODIUM 40 MG IV SOLR
40.0000 mg | Freq: Two times a day (BID) | INTRAVENOUS | Status: DC
  Administered 2021-04-19 – 2021-04-21 (×4): 40 mg via INTRAVENOUS
  Filled 2021-04-19 (×4): qty 40

## 2021-04-19 MED ORDER — LORAZEPAM 2 MG/ML IJ SOLN
0.0000 mg | Freq: Four times a day (QID) | INTRAMUSCULAR | Status: AC
  Administered 2021-04-19 – 2021-04-20 (×3): 2 mg via INTRAVENOUS
  Administered 2021-04-20: 1 mg via INTRAVENOUS
  Filled 2021-04-19 (×4): qty 1

## 2021-04-19 MED ORDER — POTASSIUM CHLORIDE 10 MEQ/100ML IV SOLN
10.0000 meq | Freq: Once | INTRAVENOUS | Status: AC
  Administered 2021-04-19: 10 meq via INTRAVENOUS
  Filled 2021-04-19: qty 100

## 2021-04-19 MED ORDER — ONDANSETRON HCL 4 MG/2ML IJ SOLN
4.0000 mg | Freq: Once | INTRAMUSCULAR | Status: AC
  Administered 2021-04-19: 4 mg via INTRAVENOUS

## 2021-04-19 MED ORDER — ONDANSETRON HCL 4 MG/2ML IJ SOLN
4.0000 mg | Freq: Once | INTRAMUSCULAR | Status: AC
  Administered 2021-04-19: 4 mg via INTRAVENOUS
  Filled 2021-04-19: qty 2

## 2021-04-19 MED ORDER — SODIUM CHLORIDE 0.9 % IV BOLUS
1000.0000 mL | Freq: Once | INTRAVENOUS | Status: AC
  Administered 2021-04-19: 1000 mL via INTRAVENOUS

## 2021-04-19 MED ORDER — ONDANSETRON HCL 4 MG/2ML IJ SOLN
4.0000 mg | Freq: Four times a day (QID) | INTRAMUSCULAR | Status: DC | PRN
  Administered 2021-04-20 – 2021-04-21 (×3): 4 mg via INTRAVENOUS
  Filled 2021-04-19 (×3): qty 2

## 2021-04-19 MED ORDER — ONDANSETRON HCL 4 MG/2ML IJ SOLN
INTRAMUSCULAR | Status: AC
  Filled 2021-04-19: qty 2

## 2021-04-19 MED ORDER — THIAMINE HCL 100 MG/ML IJ SOLN
100.0000 mg | Freq: Every day | INTRAMUSCULAR | Status: DC
  Administered 2021-04-20: 100 mg via INTRAVENOUS
  Filled 2021-04-19: qty 2

## 2021-04-19 MED ORDER — POTASSIUM CHLORIDE CRYS ER 20 MEQ PO TBCR
40.0000 meq | EXTENDED_RELEASE_TABLET | Freq: Once | ORAL | Status: AC
  Administered 2021-04-19: 40 meq via ORAL
  Filled 2021-04-19: qty 2

## 2021-04-19 MED ORDER — THIAMINE HCL 100 MG PO TABS
100.0000 mg | ORAL_TABLET | Freq: Every day | ORAL | Status: DC
  Administered 2021-04-19 – 2021-04-21 (×2): 100 mg via ORAL
  Filled 2021-04-19 (×3): qty 1

## 2021-04-19 MED ORDER — CALCIUM CARBONATE 1250 (500 CA) MG PO TABS
1.0000 | ORAL_TABLET | Freq: Every day | ORAL | Status: DC
  Administered 2021-04-19 – 2021-04-21 (×2): 500 mg via ORAL
  Filled 2021-04-19 (×4): qty 1

## 2021-04-19 MED ORDER — KETOROLAC TROMETHAMINE 30 MG/ML IJ SOLN
30.0000 mg | Freq: Three times a day (TID) | INTRAMUSCULAR | Status: DC | PRN
  Administered 2021-04-20 – 2021-04-21 (×2): 30 mg via INTRAVENOUS
  Filled 2021-04-19 (×2): qty 1

## 2021-04-19 MED ORDER — ENOXAPARIN SODIUM 40 MG/0.4ML IJ SOSY
40.0000 mg | PREFILLED_SYRINGE | INTRAMUSCULAR | Status: DC
  Administered 2021-04-19 – 2021-04-21 (×3): 40 mg via SUBCUTANEOUS
  Filled 2021-04-19 (×3): qty 0.4

## 2021-04-19 MED ORDER — PANTOPRAZOLE SODIUM 40 MG IV SOLR
40.0000 mg | INTRAVENOUS | Status: DC
  Administered 2021-04-19: 40 mg via INTRAVENOUS
  Filled 2021-04-19: qty 40

## 2021-04-19 MED ORDER — LACTATED RINGERS IV BOLUS
1000.0000 mL | Freq: Once | INTRAVENOUS | Status: AC
  Administered 2021-04-19: 1000 mL via INTRAVENOUS

## 2021-04-19 MED ORDER — METOCLOPRAMIDE HCL 5 MG/ML IJ SOLN
10.0000 mg | Freq: Three times a day (TID) | INTRAMUSCULAR | Status: DC | PRN
  Administered 2021-04-20 – 2021-04-21 (×2): 10 mg via INTRAVENOUS
  Filled 2021-04-19 (×2): qty 2

## 2021-04-19 MED ORDER — LORAZEPAM 2 MG/ML IJ SOLN
1.0000 mg | Freq: Once | INTRAMUSCULAR | Status: AC
  Administered 2021-04-19: 1 mg via INTRAVENOUS
  Filled 2021-04-19: qty 1

## 2021-04-19 MED ORDER — LORAZEPAM 1 MG PO TABS: 0.0000 mg | ORAL_TABLET | Freq: Two times a day (BID) | ORAL | Status: DC

## 2021-04-19 MED ORDER — ADULT MULTIVITAMIN W/MINERALS CH
1.0000 | ORAL_TABLET | Freq: Every day | ORAL | Status: DC
  Administered 2021-04-19 – 2021-04-21 (×2): 1 via ORAL
  Filled 2021-04-19 (×3): qty 1

## 2021-04-19 MED ORDER — POTASSIUM CHLORIDE IN NACL 20-0.9 MEQ/L-% IV SOLN
INTRAVENOUS | Status: DC
  Filled 2021-04-19: qty 1000

## 2021-04-19 MED ORDER — LORAZEPAM 2 MG/ML IJ SOLN
0.0000 mg | Freq: Two times a day (BID) | INTRAMUSCULAR | Status: DC
  Administered 2021-04-21: 1 mg via INTRAVENOUS
  Filled 2021-04-19: qty 1

## 2021-04-19 MED ORDER — LORAZEPAM 1 MG PO TABS
0.0000 mg | ORAL_TABLET | Freq: Four times a day (QID) | ORAL | Status: AC
  Administered 2021-04-19: 1 mg via ORAL
  Filled 2021-04-19: qty 1
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
## 2021-04-19 NOTE — ED Notes (Signed)
Pt denies SI, HI, or hallucinations or delusions. Pt reports drinking "8-10 bootleggers a day" and smoking marijuana every other day due to heartbreak. Pt reports last smoking marijuana 1 week ago and drank 1 bud light today.

## 2021-04-19 NOTE — ED Notes (Signed)
Pt vomiting repetitively with mucous secretions dripping from nose and mouth.

## 2021-04-19 NOTE — Progress Notes (Signed)
Patient seen and examined. 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 Still with ongoing mid epigastric pain, nausea and vomiting; Hemodynamically stable otherwise. Please refer to H&P written by Dr. Thomes Dinning for further info/details on admission.  Plan: -continue IVF's and electrolyte repletion -continue CIWA protocol and the use of folic acid and thiamine -continue PRN antiemetics and analgesics. -follow clinical response. -continue PPI.  Vassie Loll MD [phone]

## 2021-04-19 NOTE — ED Notes (Signed)
Pt continues to vomit small amounts and have dry heaves and burping. Dr. Bebe Shaggy notified.

## 2021-04-19 NOTE — H&P (Signed)
History and Physical  [name] QQI:[id number] DOB: Dec 25, 1985 DOA: 04/19/2021  Referring physician: Zadie Rhine, MD PCP: Patient, No Pcp Per (Inactive)  Patient coming from: Home  Chief Complaint: Vomiting  HPI: [name] is a 35 y.o. female with medical history significant for alcohol abuse and drug abuse who presents to the emergency department with complaint of intractable nausea and vomiting and alcohol withdrawal.  Patient was unable to provide a history due to being somnolent, though easily arousable.  History was obtained from ED physician and ED medical record.  Per report, patient was seen in the emergency department 24 hours earlier at [facility], she was supposed to be admitted but patient left ED at that time voluntarily.  She was reported to have admitted to drinking Bud Light prior to arrival.  Patient denies suicidal ideation, homicidal ideation delusion or hallucination.  She was reported to drink 8-10 bootleggers daily and smokes marijuana every other day with last marijuana being about a week ago.  ED Course:  In the emergency department, she was initially tachypneic and tachycardic, BP on arrival was 158/132.  Work-up in the ED showed normal CBC and BMP except for hypokalemia, salicylate and acetaminophen level were negative, ethanol level was 84.  Urine drug screen was positive for benzodiazepine and THC.  Urinalysis was unimpressive for UTI.  SARS coronavirus 2 was negative. Dehydration was provided, patient was started on CIWA protocol.  Hospitalist was asked to admit patient for further evaluation and management.  Review of Systems: This cannot be obtained at this time due to patient's altered mental status  Past Medical History:  Diagnosis Date   Bartholin's cyst    History reviewed. No pertinent surgical history.  Social History:  reports that she has never smoked. She has never used smokeless tobacco. She reports current alcohol use of about 2.0  standard drinks per week. She reports that she does not use drugs.   Allergies  Allergen Reactions   Morphine And Related Swelling    Arm swelling     History reviewed. No pertinent family history.   Prior to Admission medications   Medication Sig Start Date End Date Taking? Authorizing Provider  esomeprazole (NEXIUM) 40 MG capsule Take by mouth. 03/22/21 04/21/21 Yes [provider]  levonorgestrel (MIRENA) 20 MCG/24HR IUD 1 each by Intrauterine route once.    [provider]  ondansetron (ZOFRAN-ODT) 4 MG disintegrating tablet Take 4 mg by mouth every 8 (eight) hours as needed. 03/20/21   [provider]    Physical Exam: BP (!) 169/106   Pulse (!) 110   Temp 98.2 F (36.8 C)   Resp 19   Ht 5\' 7"  (1.702 m)   Wt 63.5 kg   SpO2 98%   BMI 21.93 kg/m   General: 35 y.o. year-old female well developed, somnolent, ill-appearing but in no acute distress.   HEENT: NCAT, EOMI Neck: Supple, trachea medial Cardiovascular: Regular rate and rhythm with no rubs or gallops.  No thyromegaly or JVD noted.  No lower extremity edema. 2/4 pulses in all 4 extremities. Respiratory: Clear to auscultation with no wheezes or rales. Good inspiratory effort. Abdomen: Soft, nontender nondistended with normal bowel sounds x4 quadrants. Muskuloskeletal: No cyanosis, clubbing or edema noted bilaterally Neuro: sensation intact, no focal neurologic deficit Skin: No ulcerative lesions noted or rashes Psychiatry: Mood is appropriate for condition and setting          Labs on Admission:  Basic Metabolic Panel: Recent Labs  Lab 04/17/21  1457 04/17/21 1519 04/19/21 0304  NA 140 138 138  K 3.3* 4.2 2.9*  CL 105 106 107  CO2 24  --  24  GLUCOSE 103* 103* 113*  BUN 6 4* 7  CREATININE 0.73 0.60 0.58  CALCIUM 9.2  --  7.5*   Liver Function Tests: Recent Labs  Lab 04/17/21 1457 04/19/21 0304  AST 27 38  ALT 16 22  ALKPHOS 64 58  BILITOT 0.8 0.6  PROT 7.8 7.0  ALBUMIN  4.8 4.1   Recent Labs  Lab 04/17/21 1457  LIPASE 40   No results for input(s): AMMONIA in the last 168 hours. CBC: Recent Labs  Lab 04/17/21 1457 04/17/21 1519 04/19/21 0215  WBC 8.1  --  6.3  NEUTROABS 6.7  --  4.8  HGB 13.4 9.9* 13.9  HCT 41.1 29.0* 41.8  MCV 98.1  --  96.8  PLT 174  --  177   Cardiac Enzymes: No results for input(s): CKTOTAL, CKMB, CKMBINDEX, TROPONINI in the last 168 hours.  BNP (last 3 results) No results for input(s): BNP in the last 8760 hours.  ProBNP (last 3 results) No results for input(s): PROBNP in the last 8760 hours.  CBG: No results for input(s): GLUCAP in the last 168 hours.  Radiological Exams on Admission: No results found.  EKG: I independently viewed the EKG done and my findings are as followed: Sinus tachycardia at rate of 112 bpm  Assessment/Plan Present on Admission:  Alcohol withdrawal (HCC)  Hypokalemia  Principal Problem:   Alcohol withdrawal (HCC) Active Problems:   Hypokalemia   Nausea & vomiting   Hypocalcemia   Drug abuse (HCC)  Alcohol withdrawal Alcohol level was 84 Continue CIWA protocol, thiamine and multivitamin Continue fall precaution and neuro checks Patient will be counseled on alcohol withdrawal cessation when more stable  Hypokalemia K+ is 2.9 K+ will be replenished Please monitor for AM K+ for further replenishmemnt  Nausea and vomiting Continue IV Zofran p.r.n. Continue IV hydration Patient will be started on clear liquid diet in the morning with plan to advanced as tolerated  Hypocalcemia Continue Oscal  Drug abuse Urine drug screen was positive for benzodiazepine and THC Benzodiazepine was not noted in patient's med rec Patient will be counseled on drug abuse cessation when she is more alert  GERD Continue Protonix    DVT prophylaxis: Lovenox  Code Status: Full code  Family Communication: None at bedside  Disposition Plan:  Patient is from:                         home Anticipated DC to:                   SNF or family members home Anticipated DC date:               2-3 days Anticipated DC barriers:          Patient requires inpatient management due to alcohol withdrawal symptoms, nausea and vomiting and hypokalemia which require inpatient management  Consults called: None  Admission status: Observation   Frankey Shown MD [facility]  04/19/2021, 5:57 AM

## 2021-04-19 NOTE — Plan of Care (Signed)

## 2021-04-19 NOTE — ED Provider Notes (Signed)
[facility] EMERGENCY DEPARTMENT Provider Note   CSN: [id number] Arrival date & time: 04/19/21  0046     History Chief Complaint  Patient presents with   Emesis  Level 5 caveat due to acuity of condition  [name] is a 35 y.o. female.  The history is provided by the patient. The history is limited by the condition of the patient.  Emesis Severity:  Severe Timing:  Constant Progression:  Worsening Chronicity:  New Relieved by:  Nothing Worsened by:  Nothing Patient with history of alcohol use disorder presents with intractable nausea and vomiting and alcohol withdrawal.  Patient was just seen in the emergency department the past 24 hours and was supposed to be admitted for alcohol withdrawal but she left voluntarily.  She does admit to drinking Bud Light prior to arrival. Assumed this patient woke up she began to have nausea and vomiting during my history    Past Medical History:  Diagnosis Date   Bartholin's cyst     Patient Active Problem List   Diagnosis Date Noted   Alcohol withdrawal (HCC) 04/17/2021   Hypokalemia 04/17/2021    History reviewed. No pertinent surgical history.   OB History   No obstetric history on file.     History reviewed. No pertinent family history.  Social History   Tobacco Use   Smoking status: Never   Smokeless tobacco: Never  Vaping Use   Vaping Use: Former  Substance Use Topics   Alcohol use: Yes    Alcohol/week: 2.0 standard drinks    Types: 2 Glasses of wine per week   Drug use: No    Home Medications Prior to Admission medications   Medication Sig Start Date End Date Taking? Authorizing Provider  esomeprazole (NEXIUM) 40 MG capsule Take by mouth. 03/22/21 04/21/21 Yes [provider]  levonorgestrel (MIRENA) 20 MCG/24HR IUD 1 each by Intrauterine route once.    [provider]  ondansetron (ZOFRAN-ODT) 4 MG disintegrating tablet Take 4 mg by mouth every 8 (eight) hours as needed. 03/20/21   [provider]    Allergies    Morphine and related  Review of Systems   Review of Systems  Unable to perform ROS: Acuity of condition  Gastrointestinal:  Positive for vomiting.   Physical Exam Updated Vital Signs BP (!) 145/97   Pulse 96   Temp 98.2 F (36.8 C)   Resp (!) 22   Ht 1.702 m (5\' 7" )   Wt 63.5 kg   SpO2 97%   BMI 21.93 kg/m   Physical Exam CONSTITUTIONAL: Ill-appearing HEAD: Normocephalic/atraumatic EYES: EOMI/PERRL ENMT: Mucous membranes dry NECK: supple no meningeal signs SPINE/BACK:entire spine nontender CV: S1/S2 noted, tachycardic LUNGS: Lungs are clear to auscultation bilaterally, no apparent distress ABDOMEN: soft, nontender, no rebound or guarding, bowel sounds noted throughout abdomen NEURO: Pt is sleeping on my arrival to room, easily arousable moves all extremities x4, patient tremulous EXTREMITIES: pulses normal/equal, full ROM, no deformity SKIN: warm, color normal PSYCH: Anxious  ED Results / Procedures / Treatments   Labs (all labs ordered are listed, but only abnormal results are displayed) Labs Reviewed  ETHANOL - Abnormal; Notable for the following components:      Result Value   Alcohol, Ethyl (B) 84 (*)    All other components within normal limits  SALICYLATE LEVEL - Abnormal; Notable for the following components:   Salicylate Lvl <7.0 (*)    All other components within normal limits  ACETAMINOPHEN LEVEL - Abnormal; Notable  for the following components:   Acetaminophen (Tylenol), Serum <10 (*)    All other components within normal limits  COMPREHENSIVE METABOLIC PANEL - Abnormal; Notable for the following components:   Potassium 2.9 (*)    Glucose, Bld 113 (*)    Calcium 7.5 (*)    All other components within normal limits  RESP PANEL BY RT-PCR (FLU A&B, COVID) ARPGX2  CBC WITH DIFFERENTIAL/PLATELET  RAPID URINE DRUG SCREEN, HOSP PERFORMED  URINALYSIS, ROUTINE W REFLEX MICROSCOPIC    EKG EKG  Interpretation  Date/Time:  Tuesday April 19 2021 02:04:41 EDT Ventricular Rate:  112 PR Interval:  163 QRS Duration: 79 QT Interval:  344 QTC Calculation: 470 R Axis:   64 Text Interpretation: Sinus tachycardia LAE, consider biatrial enlargement Anteroseptal infarct, age indeterminate No significant change since last tracing Confirmed by Zadie Rhine ([id number]) on 04/19/2021 2:34:27 AM  Radiology No results found.  Procedures .Critical Care  Date/Time: 04/19/2021 3:46 AM Performed by: Zadie Rhine, MD Authorized by: Zadie Rhine, MD   Critical care provider statement:    Critical care time (minutes):  35   Critical care start time:  04/19/2021 3:35 AM   Critical care end time:  04/19/2021 4:10 AM   Critical care time was exclusive of:  Separately billable procedures and treating other patients   Critical care was necessary to treat or prevent imminent or life-threatening deterioration of the following conditions:  Dehydration and CNS failure or compromise   Critical care was time spent personally by me on the following activities:  Development of treatment plan with patient or surrogate, evaluation of patient's response to treatment, examination of patient, re-evaluation of patient's condition, review of old charts, pulse oximetry, ordering and review of laboratory studies and ordering and performing treatments and interventions   I assumed direction of critical care for this patient from another provider in my specialty: no     Care discussed with: admitting provider     Medications Ordered in ED Medications  potassium chloride 10 mEq in 100 mL IVPB (10 mEq Intravenous New Bag/Given 04/19/21 0405)  LORazepam (ATIVAN) injection 0-4 mg (2 mg Intravenous Given 04/19/21 0402)    Or  LORazepam (ATIVAN) tablet 0-4 mg ( Oral See Alternative 04/19/21 0402)  LORazepam (ATIVAN) injection 0-4 mg (has no administration in time range)    Or  LORazepam (ATIVAN) tablet 0-4 mg (has no  administration in time range)  thiamine tablet 100 mg (has no administration in time range)    Or  thiamine (B-1) injection 100 mg (has no administration in time range)  sodium chloride 0.9 % bolus 1,000 mL (0 mLs Intravenous Stopped 04/19/21 0242)  ondansetron (ZOFRAN) injection 4 mg (4 mg Intravenous Given 04/19/21 0208)  sodium chloride 0.9 % bolus 1,000 mL (0 mLs Intravenous Stopped 04/19/21 0343)  LORazepam (ATIVAN) injection 1 mg (1 mg Intravenous Given 04/19/21 0247)  lactated ringers bolus 1,000 mL (1,000 mLs Intravenous New Bag/Given 04/19/21 0407)  ondansetron (ZOFRAN) injection 4 mg (4 mg Intravenous Given 04/19/21 0402)    ED Course  I have reviewed the triage vital signs and the nursing notes.  Pertinent labs  results that were available during my care of the patient were reviewed by me and considered in my medical decision making (see chart for details).    MDM Rules/Calculators/A&P                           Patient presents with continued alcohol  withdrawal.  Patient was post to be admitted to the hospital yesterday but declined.  She now returns with signs of withdrawal and begins to actively vomit when I am in the room.  It appears to be nonbloody She has an elevated CIWA score and is significantly tachycardic with any movement. Patient with escalating alcohol withdrawal and will be admitted to the hospital 4:12 AM Patient will be admitted for treatment of alcohol withdrawal she is also noted to be mildly hypokalemic. I discussed Dr. Thomes Dinning for admission. BP (!) 145/97   Pulse 96   Temp 98.2 F (36.8 C)   Resp (!) 22   Ht 1.702 m (5\' 7" )   Wt 63.5 kg   SpO2 97%   BMI 21.93 kg/m   Final Clinical Impression(s) / ED Diagnoses Final diagnoses:  Alcohol withdrawal syndrome with complication (HCC)  Intractable vomiting with nausea, unspecified vomiting type  Hypokalemia    Rx / DC Orders ED Discharge Orders     None        , MD 04/19/21  [phone]

## 2021-04-19 NOTE — ED Notes (Signed)
Pt placed on cardiac monitor with BP to set cycle every 30 minutes. Continuous pulse oximeter applied.  

## 2021-04-19 NOTE — ED Triage Notes (Signed)
Pt c/o emesis and detoxing from alcohol. Pt states she drank a bud light a couple of hours ago. Pt states she drinks a lot of alcohol daily.

## 2021-04-20 DIAGNOSIS — R112 Nausea with vomiting, unspecified: Secondary | ICD-10-CM | POA: Diagnosis present

## 2021-04-20 LAB — COMPREHENSIVE METABOLIC PANEL
ALT: 18 U/L (ref 0–44)
AST: 23 U/L (ref 15–41)
Albumin: 3.4 g/dL — ABNORMAL LOW (ref 3.5–5.0)
Alkaline Phosphatase: 48 U/L (ref 38–126)
Anion gap: 9 (ref 5–15)
BUN: 6 mg/dL (ref 6–20)
CO2: 26 mmol/L (ref 22–32)
Calcium: 8.6 mg/dL — ABNORMAL LOW (ref 8.9–10.3)
Chloride: 102 mmol/L (ref 98–111)
Creatinine, Ser: 0.65 mg/dL (ref 0.44–1.00)
GFR, Estimated: >60 mL/min (ref >60)
Glucose, Bld: 86 mg/dL (ref 70–99)
Potassium: 3.3 mmol/L — ABNORMAL LOW (ref 3.5–5.1)
Sodium: 137 mmol/L (ref 135–145)
Total Bilirubin: 1.1 mg/dL (ref 0.3–1.2)
Total Protein: 5.8 g/dL — ABNORMAL LOW (ref 6.5–8.1)

## 2021-04-20 LAB — CBC
HCT: 35.9 % — ABNORMAL LOW (ref 36.0–46.0)
Hemoglobin: 11.6 g/dL — ABNORMAL LOW (ref 12.0–15.0)
MCH: 32.3 pg (ref 26.0–34.0)
MCHC: 32.3 g/dL (ref 30.0–36.0)
MCV: 100 fL (ref 80.0–100.0)
Platelets: 158 10*3/uL (ref 150–400)
RBC: 3.59 MIL/uL — ABNORMAL LOW (ref 3.87–5.11)
RDW: 13.8 % (ref 11.5–15.5)
WBC: 5.4 10*3/uL (ref 4.0–10.5)
nRBC: 0 % (ref 0.0–0.2)

## 2021-04-20 LAB — PROTIME-INR
INR: 1 (ref 0.8–1.2)
Prothrombin Time: 13.6 seconds (ref 11.4–15.2)

## 2021-04-20 LAB — APTT: aPTT: 29 seconds (ref 24–36)

## 2021-04-20 MED ORDER — POTASSIUM CHLORIDE CRYS ER 20 MEQ PO TBCR
40.0000 meq | EXTENDED_RELEASE_TABLET | Freq: Once | ORAL | Status: AC
  Administered 2021-04-20: 40 meq via ORAL
  Filled 2021-04-20: qty 2

## 2021-04-20 MED ORDER — PROCHLORPERAZINE EDISYLATE 10 MG/2ML IJ SOLN
10.0000 mg | Freq: Four times a day (QID) | INTRAMUSCULAR | Status: DC | PRN
  Administered 2021-04-21: 10 mg via INTRAVENOUS
  Filled 2021-04-20: qty 2

## 2021-04-20 NOTE — Progress Notes (Signed)
PROGRESS NOTE    [name]  ERX:[id number] DOB: 10-14-1985 DOA: 04/19/2021 PCP: Patient, No Pcp Per (Inactive)   Chief Complaint  Patient presents with   Emesis    Brief admission narrative:  As per H&P written by Dr. Thomes Dinning on 04/19/2021 [name] is a 35 y.o. female with medical history significant for alcohol abuse and drug abuse who presents to the emergency department with complaint of intractable nausea and vomiting and alcohol withdrawal.  Patient was unable to provide a history due to being somnolent, though easily arousable.  History was obtained from ED physician and ED medical record.  Per report, patient was seen in the emergency department 24 hours earlier at [facility], she was supposed to be admitted but patient left ED at that time voluntarily.  She was reported to have admitted to drinking Bud Light prior to arrival.  Patient denies suicidal ideation, homicidal ideation delusion or hallucination.  She was reported to drink 8-10 bootleggers daily and smokes marijuana every other day with last marijuana being about a week ago.   ED Course:  In the emergency department, she was initially tachypneic and tachycardic, BP on arrival was 158/132.  Work-up in the ED showed normal CBC and BMP except for hypokalemia, salicylate and acetaminophen level were negative, ethanol level was 84.  Urine drug screen was positive for benzodiazepine and THC.  Urinalysis was unimpressive for UTI.  SARS coronavirus 2 was negative. Dehydration was provided, patient was started on CIWA protocol.  Hospitalist was asked to admit patient for further evaluation and management.  Assessment & Plan: 1-alcohol abuse/noncomplicated alcohol withdrawal (HCC) -Cessation counseling provided -Continue CIWA protocol -Continue thiamine and folic acid -Continue IV fluids and supportive care.  2-Hypokalemia -In the setting of GI losses -Continue to follow electrolytes trend and further replete as  needed.  3-Nausea & vomiting -with concerns for alcohol gastritis and potentially marijuana induced hyperemesis. -continue PRN antiemetics -continue IVF's and supportive care  4-GERD/gastritis -most likely associated with problem #1 -continue PPI -slowly advanced diet -continue antiemetics  5-Hypocalcemia -continue repletion -maintenance daily MV recommended.  6-Drug abuse (HCC) -Marijuana -cessation counseling provided.  7-dehydration -in the setting of GI from intractable nausea and vomiting  -will continue IVF's resuscitation and PRN antiemetics.    DVT prophylaxis: lovenox Code Status: full code Family Communication: no family at bedside.  Disposition:   Status is: Inpatient.  Dispo: The patient is from: Home              Anticipated d/c is to: Home              Patient currently is not medically stable to d/c.   Difficult to place patient No       Consultants:  None   Procedures:  None   Antimicrobials:  None    Subjective: Still actively vomiting and having trouble tolerating things down. No fever, no CP, no complicated alcohol withdrawal symptoms appreciated.  Objective: Vitals:   04/19/21 1755 04/19/21 2145 04/20/21 0452 04/20/21 0935  BP: 125/84 117/71 121/78 (!) 156/93  Pulse: 95 67 69 99  Resp: 20 15 14    Temp: 99 F (37.2 C) 98.5 F (36.9 C) 98.4 F (36.9 C)   TempSrc: Oral Oral Oral   SpO2: 99% 100% 100%   Weight:      Height:        Intake/Output Summary (Last 24 hours) at 04/20/2021 1056 Last data filed at 04/20/2021 0900 Gross per 24 hour  Intake 1722.92 ml  Output --  Net 1722.92 ml   Filed Weights   04/19/21 0140  Weight: 63.5 kg    Examination:  General exam: Appears calmer today; still experiencing nausea and vomiting. No fever, no CP or SOB.   Respiratory system: Clear to auscultation. Respiratory effort normal. No requiring O2 supplementation. Cardiovascular system: sinus tachycardia, no rubs, no gallops, no  murmur Gastrointestinal system: Abdomen is nondistended, soft and vaguely tender to palpation in her mid epigastric region. No organomegaly or masses felt. Normal bowel sounds heard. Central nervous system: Alert and oriented. No focal neurological deficits. Extremities: Symmetric 5 x 5 power. Skin: No rashes, lesions or ulcers Psychiatry: Judgement and insight appear normal. Mood & affect appropriate.     Data Reviewed: I have personally reviewed following labs and imaging studies  CBC: Recent Labs  Lab 04/17/21 1457 04/17/21 1519 04/19/21 0215 04/20/21 0516  WBC 8.1  --  6.3 5.4  NEUTROABS 6.7  --  4.8  --   HGB 13.4 9.9* 13.9 11.6*  HCT 41.1 29.0* 41.8 35.9*  MCV 98.1  --  96.8 100.0  PLT 174  --  177 158    Basic Metabolic Panel: Recent Labs  Lab 04/17/21 1457 04/17/21 1519 04/19/21 0304 04/19/21 0425 04/20/21 0516  NA 140 138 138  --  137  K 3.3* 4.2 2.9*  --  3.3*  CL 105 106 107  --  102  CO2 24  --  24  --  26  GLUCOSE 103* 103* 113*  --  86  BUN 6 4* 7  --  6  CREATININE 0.73 0.60 0.58  --  0.65  CALCIUM 9.2  --  7.5*  --  8.6*  MG  --   --   --  1.7  --   PHOS  --   --   --  4.1  --     GFR: Estimated Creatinine Clearance: 95.4 mL/min (by C-G formula based on SCr of 0.65 mg/dL).  Liver Function Tests: Recent Labs  Lab 04/17/21 1457 04/19/21 0304 04/20/21 0516  AST 27 38 23  ALT 16 22 18   ALKPHOS 64 58 48  BILITOT 0.8 0.6 1.1  PROT 7.8 7.0 5.8*  ALBUMIN 4.8 4.1 3.4*    CBG: No results for input(s): GLUCAP in the last 168 hours.   Recent Results (from the past 240 hour(s))  SARS CORONAVIRUS 2 (TAT 6-24 HRS) Nasopharyngeal Nasopharyngeal Swab     Status: None   Collection Time: 04/17/21 10:42 PM   Specimen: Nasopharyngeal Swab  Result Value Ref Range Status   SARS Coronavirus 2 NEGATIVE NEGATIVE Final    Comment: (NOTE) SARS-CoV-2 target nucleic acids are NOT DETECTED.  The SARS-CoV-2 RNA is generally detectable in upper and  lower respiratory specimens during the acute phase of infection. Negative results do not preclude SARS-CoV-2 infection, do not rule out co-infections with other pathogens, and should not be used as the sole basis for treatment or other patient management decisions. Negative results must be combined with clinical observations, patient history, and epidemiological information. The expected result is Negative.  Fact Sheet for Patients: 04/19/21  Fact Sheet for Healthcare Providers: HairSlick.no  This test is not yet approved or cleared by the quierodirigir.com FDA and  has been authorized for detection and/or diagnosis of SARS-CoV-2 by FDA under an Emergency Use Authorization (EUA). This EUA will remain  in effect (meaning this test can be used) for the duration of the COVID-19 declaration under Se ction 564(b)(1)  of the Act, 21 U.S.C. section 360bbb-3(b)(1), unless the authorization is terminated or revoked sooner.  Performed at [facility], [address] [place], [place] [address]   Resp Panel by RT-PCR (Flu A&B, Covid) Nasopharyngeal Swab     Status: None   Collection Time: 04/19/21  5:15 AM   Specimen: Nasopharyngeal Swab; Nasopharyngeal(NP) swabs in vial transport medium  Result Value Ref Range Status   SARS Coronavirus 2 by RT PCR NEGATIVE NEGATIVE Final    Comment: (NOTE) SARS-CoV-2 target nucleic acids are NOT DETECTED.  The SARS-CoV-2 RNA is generally detectable in upper respiratory specimens during the acute phase of infection. The lowest concentration of SARS-CoV-2 viral copies this assay can detect is 138 copies/mL. A negative result does not preclude SARS-Cov-2 infection and should not be used as the sole basis for treatment or other patient management decisions. A negative result may occur with  improper specimen collection/handling, submission of specimen other than nasopharyngeal swab, presence of  viral mutation(s) within the areas targeted by this assay, and inadequate number of viral copies(<138 copies/mL). A negative result must be combined with clinical observations, patient history, and epidemiological information. The expected result is Negative.  Fact Sheet for Patients:  BloggerCourse.comhttps://www.fda.gov/media/152166/download  Fact Sheet for Healthcare Providers:  SeriousBroker.ithttps://www.fda.gov/media/152162/download  This test is no t yet approved or cleared by the [place] FDA and  has been authorized for detection and/or diagnosis of SARS-CoV-2 by FDA under an Emergency Use Authorization (EUA). This EUA will remain  in effect (meaning this test can be used) for the duration of the COVID-19 declaration under Section 564(b)(1) of the Act, 21 U.S.C.section 360bbb-3(b)(1), unless the authorization is terminated  or revoked sooner.       Influenza A by PCR NEGATIVE NEGATIVE Final   Influenza B by PCR NEGATIVE NEGATIVE Final    Comment: (NOTE) The Xpert Xpress SARS-CoV-2/FLU/RSV plus assay is intended as an aid in the diagnosis of influenza from Nasopharyngeal swab specimens and should not be used as a sole basis for treatment. Nasal washings and aspirates are unacceptable for Xpert Xpress SARS-CoV-2/FLU/RSV testing.  Fact Sheet for Patients: BloggerCourse.comhttps://www.fda.gov/media/152166/download  Fact Sheet for Healthcare Providers: SeriousBroker.ithttps://www.fda.gov/media/152162/download  This test is not yet approved or cleared by the [place] FDA and has been authorized for detection and/or diagnosis of SARS-CoV-2 by FDA under an Emergency Use Authorization (EUA). This EUA will remain in effect (meaning this test can be used) for the duration of the COVID-19 declaration under Section 564(b)(1) of the Act, 21 U.S.C. section 360bbb-3(b)(1), unless the authorization is terminated or revoked.  Performed at [facility], [address] [place], [place] [address]          Radiology Studies: No  results found.      Scheduled Meds:  calcium carbonate  1 tablet Oral Q breakfast   enoxaparin (LOVENOX) injection  40 mg Subcutaneous Q24H   LORazepam  0-4 mg Intravenous Q6H   Or   LORazepam  0-4 mg Oral Q6H   [START ON 04/21/2021] LORazepam  0-4 mg Intravenous Q12H   Or   [START ON 04/21/2021] LORazepam  0-4 mg Oral Q12H   multivitamin with minerals  1 tablet Oral Daily   pantoprazole (PROTONIX) IV  40 mg Intravenous Q12H   potassium chloride  40 mEq Oral Once   thiamine  100 mg Oral Daily   Or   thiamine  100 mg Intravenous Daily   Continuous Infusions:  0.9 % NaCl with KCl 20 mEq / L  100 mL/hr at 04/20/21 0253     LOS: 0 days    Time spent: 35 minutes    Vassie Loll, MD [facility]   To contact the attending provider between 7A-7P or the covering provider during after hours 7P-7A, please log into the web site [email or web address] and access using universal Vivian password for that web site. If you do not have the password, please call the hospital operator.  04/20/2021, 10:56 AM

## 2021-04-20 NOTE — Progress Notes (Signed)
Pt called out for n/v. Arrived to room to find pt actively vomiting clear emesis. Pt states she felt fine, so she took a couple of sips of gingerale from her breakfast tray and then she felt nauseated and began heaving and vomiting. Total 300 ml emesis emptied in emebag. Zofran 4 mg IV given per order for nausea.

## 2021-04-20 NOTE — TOC Initial Note (Signed)
Transition of Care [facility]) - Initial/Assessment Note    Patient Details  Name: [name] MRN: [id number] Date of Birth: January 29, 1986  [facility] CM/SW Contact:    Villa Herb, LCSWA Phone Number: 04/20/2021, 11:34 AM  Clinical Narrative:                 TOC was asked by MD to provide pt with substance use resources, TOC requested consult be placed. CSW spoke with pt who states that she lives with a friend. Pt is able to complete ADLs independently. Pt does not drive but has transportation provided when it is needed. CSW spoke with pt about interest in substance use resources, pt is interested. CSW to provide resources to pt in room. TOC to follow.     Barriers to Discharge: Continued Medical Work up   Patient Goals and CMS Choice        Expected Discharge Plan and Services                                                Prior Living Arrangements/Services                       Activities of Daily Living Home Assistive Devices/Equipment: None ADL Screening (condition at time of admission) Patient's cognitive ability adequate to safely complete daily activities?: Yes Is the patient deaf or have difficulty hearing?: No Does the patient have difficulty seeing, even when wearing glasses/contacts?: No Does the patient have difficulty concentrating, remembering, or making decisions?: No Patient able to express need for assistance with ADLs?: Yes Does the patient have difficulty dressing or bathing?: No Independently performs ADLs?: Yes (appropriate for developmental age) Does the patient have difficulty walking or climbing stairs?: No Weakness of Legs: None Weakness of Arms/Hands: None  Permission Sought/Granted                  Emotional Assessment              Admission diagnosis:  Alcohol withdrawal (HCC) [F10.239] Hypokalemia [E87.6] Alcohol withdrawal syndrome with complication (HCC) [F10.239] Intractable vomiting with  nausea, unspecified vomiting type [R11.2] Patient Active Problem List   Diagnosis Date Noted   Nausea & vomiting 04/19/2021   Hypocalcemia 04/19/2021   Drug abuse (HCC) 04/19/2021   Alcohol withdrawal (HCC) 04/17/2021   Hypokalemia 04/17/2021   PCP:  Patient, No Pcp Per (Inactive) Pharmacy:   [id number] [place], [place] - [address] [facility] [address] [place] [address] Phone: [phone] Fax: [phone]  [id number] - [place], [place] - [address] AT [facility] [address] [place] [address] Phone: [phone] Fax: [phone]     Social Determinants of Health (SDOH) Interventions    Readmission Risk Interventions No flowsheet data found.

## 2021-04-21 DIAGNOSIS — K219 Gastro-esophageal reflux disease without esophagitis: Secondary | ICD-10-CM

## 2021-04-21 LAB — MAGNESIUM: Magnesium: 1.7 mg/dL (ref 1.7–2.4)

## 2021-04-21 LAB — BASIC METABOLIC PANEL
Anion gap: 7 (ref 5–15)
BUN: 5 mg/dL — ABNORMAL LOW (ref 6–20)
CO2: 23 mmol/L (ref 22–32)
Calcium: 8.5 mg/dL — ABNORMAL LOW (ref 8.9–10.3)
Chloride: 105 mmol/L (ref 98–111)
Creatinine, Ser: 0.64 mg/dL (ref 0.44–1.00)
GFR, Estimated: >60 mL/min (ref >60)
Glucose, Bld: 89 mg/dL (ref 70–99)
Potassium: 3.8 mmol/L (ref 3.5–5.1)
Sodium: 135 mmol/L (ref 135–145)

## 2021-04-21 LAB — PHOSPHORUS: Phosphorus: 3 mg/dL (ref 2.5–4.6)

## 2021-04-21 MED ORDER — CALCIUM CARBONATE 1250 (500 CA) MG PO TABS: 1.0000 | ORAL_TABLET | Freq: Every day | ORAL | 2 refills | Status: DC

## 2021-04-21 MED ORDER — ADULT MULTIVITAMIN W/MINERALS CH
1.0000 | ORAL_TABLET | Freq: Every day | ORAL | 3 refills | Status: DC
Start: 2021-04-22 — End: 2022-11-24

## 2021-04-21 MED ORDER — ONDANSETRON 8 MG PO TBDP: 4.0000 mg | ORAL_TABLET | Freq: Three times a day (TID) | ORAL | 0 refills | Status: DC | PRN

## 2021-04-21 MED ORDER — ESOMEPRAZOLE MAGNESIUM 40 MG PO CPDR: 40.0000 mg | DELAYED_RELEASE_CAPSULE | Freq: Every day | ORAL | 3 refills | Status: DC

## 2021-04-21 MED ORDER — THIAMINE HCL 100 MG PO TABS
100.0000 mg | ORAL_TABLET | Freq: Every day | ORAL | 2 refills | Status: DC
Start: 2021-04-22 — End: 2022-11-24

## 2021-04-21 MED ORDER — FOLIC ACID 1 MG PO TABS: 1.0000 mg | ORAL_TABLET | Freq: Every day | ORAL | 2 refills | Status: AC

## 2021-04-21 NOTE — Discharge Summary (Signed)
Physician Discharge Summary  [name] JYN:[id number] DOB: 11/15/1985 DOA: 04/19/2021  PCP: Patient, No Pcp Per (Inactive)  Admit date: 04/19/2021 Discharge date: 04/21/2021  Time spent: 35 minutes  Recommendations for Outpatient Follow-up:  Check BMET with to reassess electrolytes stability and trend. Continue assisting patient with alcohol and marijuana cessation.   Discharge Diagnoses:  Principal Problem:   Alcohol withdrawal (HCC) Active Problems:   Hypokalemia   Nausea & vomiting   Hypocalcemia   Drug abuse (HCC)   Intractable nausea and vomiting   Gastroesophageal reflux disease   Discharge Condition: Stable and improved.  Discharged home with instruction to follow-up with PCP in 10 days.  CODE STATUS: Full code.  Diet recommendation: Regular diet.  Filed Weights   04/19/21 0140  Weight: 63.5 kg    History of present illness:  As per H&P written by Dr. Thomes DinningAdefeso on 04/19/2021 [name] is a 35 y.o. female with medical history significant for alcohol abuse and drug abuse who presents to the emergency department with complaint of intractable nausea and vomiting and alcohol withdrawal.  Patient was unable to provide a history due to being somnolent, though easily arousable.  History was obtained from ED physician and ED medical record.  Per report, patient [facility], she was supposed to be admitted but patient left ED at that time voluntarily.  She was reported to have admitted to drinking Bud Light prior to arrival.  Patient denies suicidal ideation, homicidal ideation delusion or hallucination.  She was reported to drink 8-10 bootleggers daily and smokes marijuana every other day with last marijuana being about a week ago.   ED Course:  In the emergency department, she was initially tachypneic and tachycardic, BP on arrival was 158/132.  Work-up in the ED showed normal CBC and BMP except for hypokalemia,  salicylate and acetaminophen level were negative, ethanol level was 84.  Urine drug screen was positive for benzodiazepine and THC.  Urinalysis was unimpressive for UTI.  SARS coronavirus 2 was negative. Dehydration was provided, patient was started on CIWA protocol.  Hospitalist was asked to admit patient for further evaluation and management.  Hospital Course:  1-alcohol abuse/noncomplicated alcohol withdrawal (HCC) -Cessation counseling provided -No active withdrawal appreciated at time of discharge. -Patient discharged with instructions  to continue thiamine and folic acid supplementation. -Advised to maintain adequate hydration. -TOC has provided resources to help patient quitting.   2-Hypokalemia -In the setting of GI losses -Electrolytes repleted and within normal limits at discharge -Repeat basic metabolic panel to follow electrolytes trend.   3-Nausea & vomiting -with concerns for alcohol gastritis and potentially marijuana induced hyperemesis. -continue PRN antiemetics -Improve and is stable at time of discharge -Patient instructed to quit alcohol consumption.   4-GERD/gastritis -most likely associated with problem #1 -Continue PPI twice a day at discharge. -Continue as needed antiemetics.   5-Hypocalcemia -repleted  -maintenance daily MV recommended.   6-Drug abuse (HCC) -Marijuana -cessation counseling provided. -TOC consulted to provide outpatient/community resources to help patient quitting.   7-dehydration -in the setting of GI loses from intractable nausea and vomiting  -Resolved after fluid resuscitation -Patient advised to maintain adequate hydration.    Procedures: See below for x-ray reports.  Consultations: None  Discharge Exam: Vitals:   04/21/21 0514 04/21/21 1307  BP: 117/69 116/79  Pulse: 79 76  Resp: 19 18  Temp: 98.2 F (36.8 C) 98.1 F (36.7 C)  SpO2: 98% 99%  General: Afebrile, no chest pain, reports very little nausea earlier  that subsequently subsided and has been able to tolerate diet.  Feeling ready to go home.  No significant withdrawal symptoms. Cardiovascular: S1 and S2, no rubs, no gallops, no JVD. Respiratory: Clear to auscultation bilaterally, no using accessory muscles. Abdomen: Soft, nontender, nondistended, positive bowel sounds Extremities: No cyanosis or clubbing  Discharge Instructions   Discharge Instructions     Discharge instructions   Complete by: As directed    Follow-up resources provided by clinical social worker to assist you quitting the use of recreational drugs. Stop alcohol consumption Maintain adequate hydration Continue slowly advancing diet and take medications as prescribed. Arrange follow-up with PCP in 2 weeks.      Allergies as of 04/21/2021       Reactions   Morphine And Related Swelling   Arm swelling         Medication List     STOP taking these medications    celecoxib 200 MG capsule Commonly known as: CeleBREX   doxycycline 100 MG capsule Commonly known as: VIBRAMYCIN   promethazine 25 MG tablet Commonly known as: PHENERGAN       TAKE these medications    calcium carbonate 1250 (500 Ca) MG tablet Commonly known as: OS-CAL - dosed in mg of elemental calcium Take 1 tablet (500 mg of elemental calcium total) by mouth daily with breakfast. Start taking on: April 22, 2021   esomeprazole 40 MG capsule Commonly known as: NEXIUM Take 1 capsule (40 mg total) by mouth daily at 12 noon. What changed: when to take this   folic acid 1 MG tablet Commonly known as: FOLVITE Take 1 tablet (1 mg total) by mouth daily.   levonorgestrel 20 MCG/24HR IUD Commonly known as: MIRENA 1 each by Intrauterine route once.   multivitamin with minerals Tabs tablet Take 1 tablet by mouth daily. Start taking on: April 22, 2021   ondansetron 8 MG disintegrating tablet Commonly known as: ZOFRAN-ODT Take 0.5 tablets (4 mg total) by mouth every 8 (eight) hours  as needed. What changed: medication strength   thiamine 100 MG tablet Take 1 tablet (100 mg total) by mouth daily. Start taking on: April 22, 2021       Allergies  Allergen Reactions   Morphine And Related Swelling    Arm swelling     The results of significant diagnostics from this hospitalization (including imaging, microbiology, ancillary and laboratory) are listed below for reference.    Significant Diagnostic Studies: No results found.  Microbiology: Recent Results (from the past 240 hour(s))  SARS CORONAVIRUS 2 (TAT 6-24 HRS) Nasopharyngeal Nasopharyngeal Swab     Status: None   Collection Time: 04/17/21 10:42 PM   Specimen: Nasopharyngeal Swab  Result Value Ref Range Status   SARS Coronavirus 2 NEGATIVE NEGATIVE Final    Comment: (NOTE) SARS-CoV-2 target nucleic acids are NOT DETECTED.  The SARS-CoV-2 RNA is generally detectable in upper and lower respiratory specimens during the acute phase of infection. Negative results do not preclude SARS-CoV-2 infection, do not rule out co-infections with other pathogens, and should not be used as the sole basis for treatment or other patient management decisions. Negative results must be combined with clinical observations, patient history, and epidemiological information. The expected result is Negative.  Fact Sheet for Patients: HairSlick.no  Fact Sheet for Healthcare Providers: quierodirigir.com  This test is not yet approved or cleared by the [place] FDA and  has been authorized for detection  and/or diagnosis of SARS-CoV-2 by FDA under an Emergency Use Authorization (EUA). This EUA will remain  in effect (meaning this test can be used) for the duration of the COVID-19 declaration under Se ction 564(b)(1) of the Act, 21 U.S.C. section 360bbb-3(b)(1), unless the authorization is terminated or revoked sooner.  Performed at [facility], [address], [place], [place] [address]   Resp Panel by RT-PCR (Flu A&B, Covid) Nasopharyngeal Swab     Status: None   Collection Time: 04/19/21  5:15 AM   Specimen: Nasopharyngeal Swab; Nasopharyngeal(NP) swabs in vial transport medium  Result Value Ref Range Status   SARS Coronavirus 2 by RT PCR NEGATIVE NEGATIVE Final    Comment: (NOTE) SARS-CoV-2 target nucleic acids are NOT DETECTED.  The SARS-CoV-2 RNA is generally detectable in upper respiratory specimens during the acute phase of infection. The lowest concentration of SARS-CoV-2 viral copies this assay can detect is 138 copies/mL. A negative result does not preclude SARS-Cov-2 infection and should not be used as the sole basis for treatment or other patient management decisions. A negative result may occur with  improper specimen collection/handling, submission of specimen other than nasopharyngeal swab, presence of viral mutation(s) within the areas targeted by this assay, and inadequate number of viral copies(<138 copies/mL). A negative result must be combined with clinical observations, patient history, and epidemiological information. The expected result is Negative.  Fact Sheet for Patients:  BloggerCourse.com  Fact Sheet for Healthcare Providers:  SeriousBroker.it  This test is no t yet approved or cleared by the [place] FDA and  has been authorized for detection and/or diagnosis of SARS-CoV-2 by FDA under an Emergency Use Authorization (EUA). This EUA will remain  in effect (meaning this test can be used) for the duration of the COVID-19 declaration under Section 564(b)(1) of the Act, 21 U.S.C.section 360bbb-3(b)(1), unless the authorization is terminated  or revoked sooner.       Influenza A by PCR NEGATIVE NEGATIVE Final   Influenza B by PCR NEGATIVE NEGATIVE Final    Comment: (NOTE) The Xpert Xpress SARS-CoV-2/FLU/RSV plus assay is intended as an aid in the  diagnosis of influenza from Nasopharyngeal swab specimens and should not be used as a sole basis for treatment. Nasal washings and aspirates are unacceptable for Xpert Xpress SARS-CoV-2/FLU/RSV testing.  Fact Sheet for Patients: BloggerCourse.com  Fact Sheet for Healthcare Providers: SeriousBroker.it  This test is not yet approved or cleared by the [place] FDA and has been authorized for detection and/or diagnosis of SARS-CoV-2 by FDA under an Emergency Use Authorization (EUA). This EUA will remain in effect (meaning this test can be used) for the duration of the COVID-19 declaration under Section 564(b)(1) of the Act, 21 U.S.C. section 360bbb-3(b)(1), unless the authorization is terminated or revoked.  Performed at [facility], [address], [place], [place] [address]      Labs: Basic Metabolic Panel: Recent Labs  Lab 04/17/21 1457 04/17/21 1519 04/19/21 0304 04/19/21 0425 04/20/21 0516 04/21/21 0616  NA 140 138 138  --  137 135  K 3.3* 4.2 2.9*  --  3.3* 3.8  CL 105 106 107  --  102 105  CO2 24  --  24  --  26 23  GLUCOSE 103* 103* 113*  --  86 89  BUN 6 4* 7  --  6 5*  CREATININE 0.73 0.60 0.58  --  0.65 0.64  CALCIUM 9.2  --  7.5*  --  8.6* 8.5*  MG  --   --   --  1.7  --  1.7  PHOS  --   --   --  4.1  --  3.0   Liver Function Tests: Recent Labs  Lab 04/17/21 1457 04/19/21 0304 04/20/21 0516  AST 27 38 23  ALT 16 22 18   ALKPHOS 64 58 48  BILITOT 0.8 0.6 1.1  PROT 7.8 7.0 5.8*  ALBUMIN 4.8 4.1 3.4*   Recent Labs  Lab 04/17/21 1457  LIPASE 40    CBC: Recent Labs  Lab 04/17/21 1457 04/17/21 1519 04/19/21 0215 04/20/21 0516  WBC 8.1  --  6.3 5.4  NEUTROABS 6.7  --  4.8  --   HGB 13.4 9.9* 13.9 11.6*  HCT 41.1 29.0* 41.8 35.9*  MCV 98.1  --  96.8 100.0  PLT 174  --  177 158   Signed:  04/22/21 MD.  [facility] 04/21/2021, 3:41 PM

## 2021-04-21 NOTE — Plan of Care (Signed)

## 2021-12-23 ENCOUNTER — Emergency Department (HOSPITAL_COMMUNITY)
Admission: EM | Admit: 2021-12-23 | Discharge: 2021-12-24 | Disposition: A | Discharge status: Home / Self Care | Payer: Self-pay | Attending: Emergency Medicine | Admitting: Emergency Medicine

## 2021-12-23 ENCOUNTER — Encounter (HOSPITAL_COMMUNITY): Payer: Self-pay | Admitting: *Deleted

## 2021-12-23 ENCOUNTER — Emergency Department (HOSPITAL_COMMUNITY): Payer: Self-pay

## 2021-12-23 ENCOUNTER — Other Ambulatory Visit: Payer: Self-pay

## 2021-12-23 DIAGNOSIS — S0011XA Contusion of right eyelid and periocular area, initial encounter: Secondary | ICD-10-CM | POA: Insufficient documentation

## 2021-12-23 DIAGNOSIS — F1014 Alcohol abuse with alcohol-induced mood disorder: Secondary | ICD-10-CM

## 2021-12-23 DIAGNOSIS — E876 Hypokalemia: Secondary | ICD-10-CM | POA: Insufficient documentation

## 2021-12-23 DIAGNOSIS — R45851 Suicidal ideations: Secondary | ICD-10-CM | POA: Insufficient documentation

## 2021-12-23 DIAGNOSIS — F10929 Alcohol use, unspecified with intoxication, unspecified: Secondary | ICD-10-CM

## 2021-12-23 DIAGNOSIS — F10129 Alcohol abuse with intoxication, unspecified: Secondary | ICD-10-CM | POA: Insufficient documentation

## 2021-12-23 DIAGNOSIS — X58XXXA Exposure to other specified factors, initial encounter: Secondary | ICD-10-CM | POA: Insufficient documentation

## 2021-12-23 DIAGNOSIS — Y908 Blood alcohol level of 240 mg/100 ml or more: Secondary | ICD-10-CM | POA: Insufficient documentation

## 2021-12-23 DIAGNOSIS — F332 Major depressive disorder, recurrent severe without psychotic features: Secondary | ICD-10-CM | POA: Insufficient documentation

## 2021-12-23 DIAGNOSIS — F10920 Alcohol use, unspecified with intoxication, uncomplicated: Secondary | ICD-10-CM

## 2021-12-23 LAB — CBC
HCT: 42 % (ref 36.0–46.0)
Hemoglobin: 14.1 g/dL (ref 12.0–15.0)
MCH: 31.3 pg (ref 26.0–34.0)
MCHC: 33.6 g/dL (ref 30.0–36.0)
MCV: 93.3 fL (ref 80.0–100.0)
Platelets: 289 10*3/uL (ref 150–400)
RBC: 4.5 MIL/uL (ref 3.87–5.11)
RDW: 12.8 % (ref 11.5–15.5)
WBC: 6.6 10*3/uL (ref 4.0–10.5)
nRBC: 0 % (ref 0.0–0.2)

## 2021-12-23 LAB — COMPREHENSIVE METABOLIC PANEL
ALT: 15 U/L (ref 0–44)
AST: 26 U/L (ref 15–41)
Albumin: 4 g/dL (ref 3.5–5.0)
Alkaline Phosphatase: 89 U/L (ref 38–126)
Anion gap: 11 (ref 5–15)
BUN: 7 mg/dL (ref 6–20)
CO2: 25 mmol/L (ref 22–32)
Calcium: 8.5 mg/dL — ABNORMAL LOW (ref 8.9–10.3)
Chloride: 106 mmol/L (ref 98–111)
Creatinine, Ser: 0.63 mg/dL (ref 0.44–1.00)
GFR, Estimated: >60 mL/min (ref >60)
Glucose, Bld: 110 mg/dL — ABNORMAL HIGH (ref 70–99)
Potassium: 3 mmol/L — ABNORMAL LOW (ref 3.5–5.1)
Sodium: 142 mmol/L (ref 135–145)
Total Bilirubin: 0.3 mg/dL (ref 0.3–1.2)
Total Protein: 7.9 g/dL (ref 6.5–8.1)

## 2021-12-23 LAB — SALICYLATE LEVEL: Salicylate Lvl: <7 mg/dL — ABNORMAL LOW (ref 7.0–30.0)

## 2021-12-23 LAB — ETHANOL: Alcohol, Ethyl (B): 380 mg/dL (ref <10)

## 2021-12-23 LAB — ACETAMINOPHEN LEVEL: Acetaminophen (Tylenol), Serum: <10 ug/mL — ABNORMAL LOW (ref 10–30)

## 2021-12-23 MED ORDER — THIAMINE HCL 100 MG PO TABS
100.0000 mg | ORAL_TABLET | Freq: Every day | ORAL | Status: DC
  Administered 2021-12-23: 100 mg via ORAL
  Filled 2021-12-23: qty 1

## 2021-12-23 MED ORDER — LORAZEPAM 2 MG/ML IJ SOLN: 0.0000 mg | Freq: Two times a day (BID) | INTRAMUSCULAR | Status: DC

## 2021-12-23 MED ORDER — POTASSIUM CHLORIDE CRYS ER 20 MEQ PO TBCR
40.0000 meq | EXTENDED_RELEASE_TABLET | Freq: Once | ORAL | Status: AC
  Administered 2021-12-23: 40 meq via ORAL
  Filled 2021-12-23: qty 2

## 2021-12-23 MED ORDER — LORAZEPAM 1 MG PO TABS: 0.0000 mg | ORAL_TABLET | Freq: Two times a day (BID) | ORAL | Status: DC

## 2021-12-23 MED ORDER — LORAZEPAM 2 MG/ML IJ SOLN: 0.0000 mg | Freq: Four times a day (QID) | INTRAMUSCULAR | Status: DC

## 2021-12-23 MED ORDER — LORAZEPAM 1 MG PO TABS
0.0000 mg | ORAL_TABLET | Freq: Four times a day (QID) | ORAL | Status: DC
  Administered 2021-12-23: 1 mg via ORAL
  Filled 2021-12-23: qty 1

## 2021-12-23 MED ORDER — THIAMINE HCL 100 MG/ML IJ SOLN: 100.0000 mg | Freq: Every day | INTRAMUSCULAR | Status: DC

## 2021-12-23 MED ORDER — ZIPRASIDONE MESYLATE 20 MG IM SOLR
10.0000 mg | Freq: Once | INTRAMUSCULAR | Status: AC
  Administered 2021-12-23: 10 mg via INTRAMUSCULAR
  Filled 2021-12-23: qty 20
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
## 2021-12-23 NOTE — ED Notes (Addendum)
Pt is loud and saying provocative things to staff and other patients that are inappropriate. Pt is also using lewd and foul language. ? ?Pt was cooperative with taking medications ?

## 2021-12-23 NOTE — ED Notes (Addendum)
Cell phone placed in pt belongings bag, in locker ?

## 2021-12-23 NOTE — ED Notes (Signed)
Patient transported to CT 
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
## 2021-12-23 NOTE — ED Notes (Signed)
Pt able to swallow po meds without difficulty.  ?

## 2021-12-23 NOTE — ED Notes (Addendum)
Ice pack applied to left ankle for pain and swelling. Pt unable to urinate while dressing out. Nurse noted dark bruises to left thigh and legs areas. No pain or nodules noted. Pt crying and holding her head down.  ?

## 2021-12-23 NOTE — ED Notes (Signed)
Tray given to pt and pt able to feed self.  ?
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
## 2021-12-23 NOTE — BH Assessment (Addendum)
Pt BAL was 380 at 18:42.  Pt also had been given Ativan at 18:10.  Pt will be seen by TTS after her BAL is reasonably believed to be below 200.   ?
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
## 2021-12-23 NOTE — ED Notes (Signed)
Date and time results received: 12/23/21 6:42 PM ? ? ? ?Test: ETOH ?Critical Value: 380 ? ?Name of Provider Notified: Kem Parkinson ? ?Orders Received? Or Actions Taken?: see orders ?

## 2021-12-24 LAB — RAPID URINE DRUG SCREEN, HOSP PERFORMED
Amphetamines: NOT DETECTED
Barbiturates: NOT DETECTED
Benzodiazepines: NOT DETECTED
Cocaine: NOT DETECTED
Opiates: NOT DETECTED
Tetrahydrocannabinol: POSITIVE — AB

## 2021-12-24 LAB — POC URINE PREG, ED: Preg Test, Ur: NEGATIVE

## 2021-12-24 NOTE — ED Notes (Signed)
Pt walked to the bathroom unassisted with sitter ?

## 2021-12-24 NOTE — ED Notes (Signed)
VS delayed to facilitate therapeutic sleep ?

## 2021-12-24 NOTE — Discharge Instructions (Addendum)
It was our pleasure to provide your ER care today - we hope that you feel better. ? ?Avoid alcohol use as it is harmful to your physical health and mental well-being. See resource guide provided for treatment program options.  ? ?Follow up with primary care doctor in one week - also have blood pressure rechecked then, as it is mildly high today. ? ?For mental health follow up - see resources provided, and discuss with primary care doctor. ? ?Return to ER if worse, new symptoms, fevers, trouble breathing, or other concern.  ?

## 2021-12-24 NOTE — ED Notes (Signed)
recommended observation of patient and re-evaluated by psychiatry during the day.   ?
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
## 2021-12-24 NOTE — ED Provider Notes (Signed)
Emergency Medicine Observation Re-evaluation Note ? ?[name] is a 36 y.o. female, seen on rounds today.  Pt initially presented to the ED for complaints of etoh intoxication and related mood disorder. Pt reports feeling much improved this AM, denies any thoughts of harm to self/others, requests d/c.  ? ?Physical Exam  ?BP (!) 132/100 (BP Location: Right Arm)   Pulse 92   Temp 98 ?F (36.7 ?C) (Oral)   Resp 18   Wt 61.2 kg   LMP 12/09/2021 (Approximate)   SpO2 100%   BMI 21.14 kg/m?  ?Physical Exam ?General: alert, content.  ?Cardiac: regular rate ?Lungs: breathing comfortably ?Psych: normal mood and affect. Pt does not appear to be acutely depressed or despondent. Pt denies any thoughts of harm to self or others. No SI.  Pt is not responding to internal stimuli - no delusions or hallucinations noted.  ?Neuro:   alert, oriented. No tremor or shakes. Steady gait.  ? ?ED Course / MDM  ? ? ?I have reviewed the labs performed to date as well as medications administered while in observation.  Recent changes in the last 24 hours include metabolism of etoh, ED obs, and reassessment.  ? ?Plan  ? ? ?Pt reports feeling much improved this AM and requests d/c. She indicates feels earlier symptoms/comments were related to excessive etoh consumption. Pt denies hx etoh withdrawal, shakes, seizures or dts.  Normal appetite. No SI, no acute psychosis.  ? ?Pt currently appears stable for d/c. ? ?Will provide resource guide, encouraged outpatient follow up.  ? ? ? ? ?  ?Cathren Laine, MD ?12/24/21 7342 ? ?

## 2021-12-24 NOTE — ED Notes (Signed)
Pt getting TTS 

## 2022-01-19 ENCOUNTER — Telehealth: Payer: Self-pay

## 2022-01-19 NOTE — Telephone Encounter (Signed)
Attempted to call Ms [name] for follow up, she was in [facility] office earlier this week and discussed program and documents needed. She requested to take an application with her and would bring it back in.  No answer today, left voicemail requesting return call.  Francee Nodal RN [employer]

## 2022-05-20 ENCOUNTER — Other Ambulatory Visit: Payer: Self-pay

## 2022-05-20 ENCOUNTER — Emergency Department (HOSPITAL_COMMUNITY)
Admission: EM | Admit: 2022-05-20 | Discharge: 2022-05-20 | Discharge status: Against Medical Advice | Payer: Self-pay | Attending: Emergency Medicine | Admitting: Emergency Medicine

## 2022-05-20 DIAGNOSIS — F32A Depression, unspecified: Secondary | ICD-10-CM | POA: Insufficient documentation

## 2022-05-20 DIAGNOSIS — Z046 Encounter for general psychiatric examination, requested by authority: Secondary | ICD-10-CM | POA: Insufficient documentation

## 2022-05-20 DIAGNOSIS — Z5321 Procedure and treatment not carried out due to patient leaving prior to being seen by health care provider: Secondary | ICD-10-CM | POA: Insufficient documentation

## 2022-05-20 NOTE — ED Triage Notes (Signed)
Pt decided to leave during triage.
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
## 2022-06-23 ENCOUNTER — Encounter (HOSPITAL_COMMUNITY): Payer: Self-pay | Admitting: Emergency Medicine

## 2022-06-23 ENCOUNTER — Emergency Department (HOSPITAL_COMMUNITY)
Admission: EM | Admit: 2022-06-23 | Discharge: 2022-06-24 | Disposition: A | Discharge status: Home / Self Care | Payer: Self-pay | Attending: Emergency Medicine | Admitting: Emergency Medicine

## 2022-06-23 DIAGNOSIS — F1094 Alcohol use, unspecified with alcohol-induced mood disorder: Secondary | ICD-10-CM | POA: Insufficient documentation

## 2022-06-23 DIAGNOSIS — E876 Hypokalemia: Secondary | ICD-10-CM | POA: Insufficient documentation

## 2022-06-23 DIAGNOSIS — Y908 Blood alcohol level of 240 mg/100 ml or more: Secondary | ICD-10-CM | POA: Insufficient documentation

## 2022-06-23 DIAGNOSIS — F12988 Cannabis use, unspecified with other cannabis-induced disorder: Secondary | ICD-10-CM | POA: Insufficient documentation

## 2022-06-23 DIAGNOSIS — F332 Major depressive disorder, recurrent severe without psychotic features: Secondary | ICD-10-CM | POA: Insufficient documentation

## 2022-06-23 DIAGNOSIS — R45851 Suicidal ideations: Secondary | ICD-10-CM

## 2022-06-23 LAB — COMPREHENSIVE METABOLIC PANEL
ALT: 14 U/L (ref 0–44)
AST: 17 U/L (ref 15–41)
Albumin: 4.4 g/dL (ref 3.5–5.0)
Alkaline Phosphatase: 55 U/L (ref 38–126)
Anion gap: 7 (ref 5–15)
BUN: 8 mg/dL (ref 6–20)
CO2: 24 mmol/L (ref 22–32)
Calcium: 8.6 mg/dL — ABNORMAL LOW (ref 8.9–10.3)
Chloride: 110 mmol/L (ref 98–111)
Creatinine, Ser: 0.63 mg/dL (ref 0.44–1.00)
GFR, Estimated: >60 mL/min (ref >60)
Glucose, Bld: 101 mg/dL — ABNORMAL HIGH (ref 70–99)
Potassium: 3.2 mmol/L — ABNORMAL LOW (ref 3.5–5.1)
Sodium: 141 mmol/L (ref 135–145)
Total Bilirubin: 0.4 mg/dL (ref 0.3–1.2)
Total Protein: 7.6 g/dL (ref 6.5–8.1)

## 2022-06-23 LAB — RAPID URINE DRUG SCREEN, HOSP PERFORMED
Amphetamines: NOT DETECTED
Barbiturates: NOT DETECTED
Benzodiazepines: NOT DETECTED
Cocaine: NOT DETECTED
Opiates: NOT DETECTED
Tetrahydrocannabinol: POSITIVE — AB

## 2022-06-23 LAB — ACETAMINOPHEN LEVEL: Acetaminophen (Tylenol), Serum: <10 ug/mL — ABNORMAL LOW (ref 10–30)

## 2022-06-23 LAB — ETHANOL: Alcohol, Ethyl (B): 309 mg/dL (ref <10)

## 2022-06-23 LAB — CBC
HCT: 42.1 % (ref 36.0–46.0)
Hemoglobin: 13.7 g/dL (ref 12.0–15.0)
MCH: 31.2 pg (ref 26.0–34.0)
MCHC: 32.5 g/dL (ref 30.0–36.0)
MCV: 95.9 fL (ref 80.0–100.0)
Platelets: 256 10*3/uL (ref 150–400)
RBC: 4.39 MIL/uL (ref 3.87–5.11)
RDW: 12.7 % (ref 11.5–15.5)
WBC: 8.5 10*3/uL (ref 4.0–10.5)
nRBC: 0 % (ref 0.0–0.2)

## 2022-06-23 LAB — POC URINE PREG, ED: Preg Test, Ur: NEGATIVE

## 2022-06-23 LAB — SALICYLATE LEVEL: Salicylate Lvl: <7 mg/dL — ABNORMAL LOW (ref 7.0–30.0)

## 2022-06-23 NOTE — ED Provider Notes (Signed)
[facility] EMERGENCY DEPARTMENT Provider Note   CSN: [id number] Arrival date & time: 06/23/22  2104     History  Chief Complaint  Patient presents with   Medical Clearance    [name] is a 36 y.o. female.  Patient presents to the emergency department accompanied by law enforcement for possible involuntary commitment.  Patient reportedly found by police after 427 call knocking on multiple people's doors.  When law enforcement attempted to help find the patient a place to stay she stated she wanted to go in front of traffic and kill herself.  She also endorses wanting to cut herself with shears to try to kill herself.  Upon arrival to the emergency department the patient endorses suicidal ideations, thoughts of self-harm, but denies hallucinations or homicidal ideations.  She does endorse "heartbreak" as an inciting factor.  Past medical history significant for alcohol withdrawal and drug abuse  HPI     Home Medications Prior to Admission medications   Medication Sig Start Date End Date Taking? Authorizing Provider  calcium carbonate (OS-CAL - DOSED IN MG OF ELEMENTAL CALCIUM) 1250 (500 Ca) MG tablet Take 1 tablet (500 mg of elemental calcium total) by mouth daily with breakfast. Patient not taking: Reported on 12/23/2021 04/22/21   Barton Dubois, MD  esomeprazole (NEXIUM) 40 MG capsule Take 1 capsule (40 mg total) by mouth daily at 12 noon. Patient not taking: Reported on 12/23/2021 04/21/21 05/21/21  Barton Dubois, MD  levonorgestrel [facility]) 20 MCG/24HR IUD 1 each by Intrauterine route once.    [provider]  Multiple Vitamin (MULTIVITAMIN WITH MINERALS) TABS tablet Take 1 tablet by mouth daily. Patient not taking: Reported on 12/23/2021 04/22/21   Barton Dubois, MD  ondansetron (ZOFRAN-ODT) 8 MG disintegrating tablet Take 0.5 tablets (4 mg total) by mouth every 8 (eight) hours as needed. Patient not taking: Reported on 12/23/2021 04/21/21   Barton Dubois, MD  thiamine 100 MG  tablet Take 1 tablet (100 mg total) by mouth daily. Patient not taking: Reported on 12/23/2021 04/22/21   Barton Dubois, MD      Allergies    Morphine and related    Review of Systems   Review of Systems  Psychiatric/Behavioral:  Positive for dysphoric mood, self-injury (Thoughts of) and suicidal ideas. Negative for hallucinations.     Physical Exam Updated Vital Signs BP 122/75 (BP Location: Right Arm)   Pulse (!) 107   Resp 15   Ht 5\' 7"  (1.702 m)   Wt 67.1 kg   SpO2 98%   BMI 23.17 kg/m  Physical Exam HENT:     Head: Normocephalic and atraumatic.     Mouth/Throat:     Mouth: Mucous membranes are moist.  Eyes:     Conjunctiva/sclera: Conjunctivae normal.  Cardiovascular:     Rate and Rhythm: Normal rate and regular rhythm.  Pulmonary:     Effort: Pulmonary effort is normal. No respiratory distress.     Breath sounds: Normal breath sounds.  Abdominal:     Palpations: Abdomen is soft.  Musculoskeletal:        General: No signs of injury.     Cervical back: Normal range of motion.  Skin:    General: Skin is dry.  Neurological:     Mental Status: She is alert and oriented to person, place, and time.  Psychiatric:        Speech: Speech normal.     Comments: Patient tearful    ED Results / Procedures / Treatments  Labs (all labs ordered are listed, but only abnormal results are displayed) Labs Reviewed  CBC  COMPREHENSIVE METABOLIC PANEL  ETHANOL  SALICYLATE LEVEL  ACETAMINOPHEN LEVEL  RAPID URINE DRUG SCREEN, HOSP PERFORMED  POC URINE PREG, ED    EKG None  Radiology No results found.  Procedures Procedures    Medications Ordered in ED Medications - No data to display  ED Course/ Medical Decision Making/ A&P                           Medical Decision Making Amount and/or Complexity of Data Reviewed Labs: ordered.   Patient placed under IVC due to endorsing thoughts of killing herself.  Medical clearance labs ordered.  I reviewed lab  results.  Pertinent results include mild hypokalemia with potassium 3.2.  Unremarkable CBC.  Negative urine pregnancy test.  UDS positive for THC.  Ethanol, salicylate, acetaminophen level pending at this time.  Patient clear for TTS consult at this time.  Medically clear pending ethanol, salicylate, and acetaminophen levels.  Diet and home meds addressed.       Final Clinical Impression(s) / ED Diagnoses Final diagnoses:  None    Rx / DC Orders ED Discharge Orders     None         Pamala Duffel 06/23/22 2310    Rondel Baton, MD 06/24/22 1309

## 2022-06-23 NOTE — ED Notes (Signed)
Security to bedside to wand pt 
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
## 2022-06-24 DIAGNOSIS — F1094 Alcohol use, unspecified with alcohol-induced mood disorder: Secondary | ICD-10-CM

## 2022-06-24 NOTE — ED Notes (Signed)
IVC paperwork faxed to [facility] @ [phone] and [phone] 
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
## 2022-06-24 NOTE — ED Provider Notes (Addendum)
Emergency Medicine Observation Re-evaluation Note  [name] is a 36 y.o. female, seen on rounds today.  Pt initially presented to the ED for complaints of Medical Clearance Currently, the patient is awake and alert.  Pt just finished her TTS eval.  Pt is currently denying SI.  She has been trying to stay sober and relapsed last night.  She said she has a plan to live with her mom, get a job, and stay sober.    Physical Exam  BP 122/75 (BP Location: Right Arm)   Pulse (!) 107   Temp 98.4 F (36.9 C) (Axillary)   Resp 15   Ht 5\' 7"  (1.702 m)   Wt 67.1 kg   SpO2 98%   BMI 23.17 kg/m  Physical Exam General: awake and alert Cardiac: rr Lungs: clear Psych: calm.  No current SI.  ED Course / MDM  EKG:   I have reviewed the labs performed to date as well as medications administered while in observation.  Recent changes in the last 24 hours include TTS eval this am.  IVC papers taken out yesterday.  Plan  Current plan is for TTS consult.    Isla Pence, MD 06/24/22 0732  PT has been re-evaluated by psych.  She is psych cleared.  I will rescind the IVC.   Isla Pence, MD 06/24/22 1139

## 2022-06-24 NOTE — ED Notes (Signed)
TTS currently in progress 

## 2022-06-24 NOTE — ED Notes (Signed)
Pt is awake and alert and is pleasantly interacting with staff this morning. She is adamant that she is not actually suicidal and that her suicidal expression last night was only due to alcohol intoxication. She has a plan to live with her mom in a sober home, start a new job, and avoid triggers and old habits that might cause her to relapse again. MD notified. RN explained that pt will need to be psychiatrically cleared before she can be discharged; pt verbalized understanding.

## 2022-06-24 NOTE — Consult Note (Signed)
Telepsych Consultation   Reason for Consult:  Telepsych Assessment for SI and alcohol intoxication Referring Physician:  Cherlynn June PA-C Location of Patient:    [facility] Location of Provider: Other: virtual home office  Patient Identification: [name] MRN:  [id number] Principal Diagnosis: Alcohol-induced mood disorder ([place]) Diagnosis:  Principal Problem:   Alcohol-induced mood disorder ([place])   Total Time spent with patient: 30 minutes  Subjective:   [name] is a 36 y.o. female patient admitted for suicidal ideations and acute alcohol intoxication.  Patient seen via telepsych by this provider; chart reviewed and consulted with Dr. Caswell Corwin on 06/25/22.  On evaluation [name] presents very cheerful, states since admission, she has decided to stop drinking.  When asked about events that led to current hospitalization, pt reports she was told she told the cops she wanted to kill herself but she does not recall making this statement.  She states, "I was drinking and that must've been the alcohol talking or maybe I blacked out."  Pt states she often say things when she is intoxicated, has suicidal ideations that clear up and she cannot remember once sober. Admission BAL was 309, UDS + THC. She denies withdrawal symptoms today, denies n/v, diarrhea, tremors or GI disturbance.  At present she is alert and oriented x4.  She does not appear intoxicated, is able to engage in conversation with this writer, no deficits noted.  Pt reports a history for alcoholism, with intermittent periods of sobriety.  Reports she's been sober for the past 6 months and relapsed yesterday because she wanted to celebrate her new job.   She reports she has many psychosocial stressors, and although she is happy she started a new job and is also a little stressed over it as well.  At present she report she is single, just broke with her boyfriend whom she was with for several years.  Reports, "I poured  my heartache into a bottle and I shouldn't have done that."  She is not established with anyone for outpatient psychiatry and request resources today.  Pt states she does not want inpatient admission and declines residential SUD treatment.  Reports she has already enrolled in [facility] and has a sponsor who is also a good support system.  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    During evaluation [name] is sitting with her legs folded on the hospital bed; She is alert/oriented x 4; appears embarrassed but is otherwise cooperative; and mood congruent with affect.  Patient is speaking in a clear tone at moderate volume, and normal pace; with good eye contact.  Her thought process is coherent and relevant; There is no indication that she is currently responding to internal/external stimuli or experiencing delusional thought content.  Patient denies suicidal/self-harm/homicidal ideation, psychosis, and paranoia.  Patient has remained cooperative throughout assessment and has answered questions appropriately.     Per ED Provider Admission Assessment 06/23/2022: History      Chief Complaint  Patient presents with   Medical Clearance      [name] is a 36 y.o. female.  Patient presents to the emergency department accompanied by law enforcement for possible involuntary commitment.  Patient reportedly found by police after 502 call knocking on multiple people's doors.  When law enforcement attempted to help find the patient a place to stay she stated she wanted to go in front of traffic and  kill herself.  She also endorses wanting to cut herself with shears to try to kill herself.  Upon arrival to the emergency department the patient endorses suicidal ideations, thoughts of self-harm, but denies hallucinations or homicidal ideations.  She does endorse "heartbreak" as an inciting factor.  Past medical history significant for alcohol  withdrawal and drug abuse.   Past Psychiatric History: Alcohol abuse; alcohol withdrawal; hx of polysubstance abuse  Risk to Self:  denies Risk to Others:  denies Prior Inpatient Therapy:  yes Prior Outpatient Therapy:  no  Past Medical History:  Past Medical History:  Diagnosis Date   Bartholin's cyst    History reviewed. No pertinent surgical history. Family History: History reviewed. No pertinent family history. Family Psychiatric  History: pt denies Social History:  Social History   Substance and Sexual Activity  Alcohol Use Yes   Alcohol/week: 2.0 standard drinks of alcohol   Types: 2 Glasses of wine per week     Social History   Substance and Sexual Activity  Drug Use No    Social History   Socioeconomic History   Marital status: Single    Spouse name: Not on file   Number of children: Not on file   Years of education: Not on file   Highest education level: Not on file  Occupational History   Not on file  Tobacco Use   Smoking status: Never   Smokeless tobacco: Never  Vaping Use   Vaping Use: Former  Substance and Sexual Activity   Alcohol use: Yes    Alcohol/week: 2.0 standard drinks of alcohol    Types: 2 Glasses of wine per week   Drug use: No   Sexual activity: Not on file  Other Topics Concern   Not on file  Social History Narrative   Not on file   Social Determinants of Health   Financial Resource Strain: Not on file  Food Insecurity: Not on file  Transportation Needs: Not on file  Physical Activity: Not on file  Stress: Not on file  Social Connections: Not on file   Additional Social History:    Allergies:   Allergies  Allergen Reactions   Morphine And Related Swelling    Arm swelling     Labs:  Results for orders placed or performed during the hospital encounter of 06/23/22 (from the past 48 hour(s))  Comprehensive metabolic panel     Status: Abnormal   Collection Time: 06/23/22  9:32 PM  Result Value Ref Range   Sodium 141  135 - 145 mmol/L   Potassium 3.2 (L) 3.5 - 5.1 mmol/L   Chloride 110 98 - 111 mmol/L   CO2 24 22 - 32 mmol/L   Glucose, Bld 101 (H) 70 - 99 mg/dL    Comment: Glucose reference range applies only to samples taken after fasting for at least 8 hours.   BUN 8 6 - 20 mg/dL   Creatinine, Ser 9.67 0.44 - 1.00 mg/dL   Calcium 8.6 (L) 8.9 - 10.3 mg/dL   Total Protein 7.6 6.5 - 8.1 g/dL   Albumin 4.4 3.5 - 5.0 g/dL   AST 17 15 - 41 U/L   ALT 14 0 - 44 U/L   Alkaline Phosphatase 55 38 - 126 U/L   Total Bilirubin 0.4 0.3 - 1.2 mg/dL   GFR, Estimated >89 >38 mL/min    Comment: (NOTE) Calculated using the CKD-EPI Creatinine Equation (2021)    Anion gap 7 5 - 15    Comment:  Performed at [facility], [address] [place], [place] [address]  cbc     Status: None   Collection Time: 06/23/22  9:32 PM  Result Value Ref Range   WBC 8.5 4.0 - 10.5 K/uL   RBC 4.39 3.87 - 5.11 MIL/uL   Hemoglobin 13.7 12.0 - 15.0 g/dL   HCT 19.142.1 47.836.0 - 29.546.0 %   MCV 95.9 80.0 - 100.0 fL   MCH 31.2 26.0 - 34.0 pg   MCHC 32.5 30.0 - 36.0 g/dL   RDW 62.112.7 30.811.5 - 65.715.5 %   Platelets 256 150 - 400 K/uL   nRBC 0.0 0.0 - 0.2 %    Comment: Performed at [facility], [address] [place], [place] [address]  Ethanol     Status: Abnormal   Collection Time: 06/23/22  9:33 PM  Result Value Ref Range   Alcohol, Ethyl (B) 309 (HH) <10 mg/dL    Comment: CRITICAL RESULT CALLED TO, READ BACK BY AND VERIFIED WITH: [name] BY LBASTON AT 2319, 06/23/22 (NOTE) Lowest detectable limit for serum alcohol is 10 mg/dL.  For medical purposes only. Performed at [facility], [address] [place], [place] [address]   Salicylate level     Status: Abnormal   Collection Time: 06/23/22  9:33 PM  Result Value Ref Range   Salicylate Lvl <7.0 (L) 7.0 - 30.0 mg/dL    Comment: Performed at [facility], [address] [place], [place] [address]  Acetaminophen level     Status: Abnormal   Collection Time: 06/23/22  9:33 PM  Result Value  Ref Range   Acetaminophen (Tylenol), Serum <10 (L) 10 - 30 ug/mL    Comment: (NOTE) Therapeutic concentrations vary significantly. A range of 10-30 ug/mL  may be an effective concentration for many patients. However, some  are best treated at concentrations outside of this range. Acetaminophen concentrations >150 ug/mL at 4 hours after ingestion  and >50 ug/mL at 12 hours after ingestion are often associated with  toxic reactions.  Performed at [facility], [address] [place], [place] [address]   Rapid urine drug screen (hospital performed)     Status: Abnormal   Collection Time: 06/23/22 10:27 PM  Result Value Ref Range   Opiates NONE DETECTED NONE DETECTED   Cocaine NONE DETECTED NONE DETECTED   Benzodiazepines NONE DETECTED NONE DETECTED   Amphetamines NONE DETECTED NONE DETECTED   Tetrahydrocannabinol POSITIVE (A) NONE DETECTED   Barbiturates NONE DETECTED NONE DETECTED    Comment: (NOTE) DRUG SCREEN FOR MEDICAL PURPOSES ONLY.  IF CONFIRMATION IS NEEDED FOR ANY PURPOSE, NOTIFY LAB WITHIN 5 DAYS.  LOWEST DETECTABLE LIMITS FOR URINE DRUG SCREEN Drug Class                     Cutoff (ng/mL) Amphetamine and metabolites    1000 Barbiturate and metabolites    200 Benzodiazepine                 200 Opiates and metabolites        300 Cocaine and metabolites        300 THC                            50 Performed at [facility], [address] [place], [place] [address]   POC urine preg, ED     Status: None   Collection Time: 06/23/22 10:29 PM  Result Value Ref Range  Preg Test, Ur NEGATIVE NEGATIVE    Comment:        THE SENSITIVITY OF THIS METHODOLOGY IS >24 mIU/mL     Medications:  No current facility-administered medications for this encounter.   Current Outpatient Medications  Medication Sig Dispense Refill   calcium carbonate (OS-CAL - DOSED IN MG OF ELEMENTAL CALCIUM) 1250 (500 Ca) MG tablet Take 1 tablet (500 mg of elemental calcium total) by mouth daily  with breakfast. (Patient not taking: Reported on 12/23/2021) 30 tablet 2   esomeprazole (NEXIUM) 40 MG capsule Take 1 capsule (40 mg total) by mouth daily at 12 noon. (Patient not taking: Reported on 12/23/2021) 60 capsule 3   levonorgestrel (MIRENA) 20 MCG/24HR IUD 1 each by Intrauterine route once.     Multiple Vitamin (MULTIVITAMIN WITH MINERALS) TABS tablet Take 1 tablet by mouth daily. (Patient not taking: Reported on 12/23/2021) 30 tablet 3   ondansetron (ZOFRAN-ODT) 8 MG disintegrating tablet Take 0.5 tablets (4 mg total) by mouth every 8 (eight) hours as needed. (Patient not taking: Reported on 12/23/2021) 30 tablet 0   thiamine 100 MG tablet Take 1 tablet (100 mg total) by mouth daily. (Patient not taking: Reported on 12/23/2021) 30 tablet 2    Musculoskeletal: pt moves all extremities and ambulates independently Strength & Muscle Tone: within normal limits Gait & Station: normal Patient leans: N/A   Psychiatric Specialty Exam:  Presentation  General Appearance: Casual; Fairly Groomed  Eye Contact:Good  Speech:Clear and Coherent; Normal Rate  Speech Volume:Normal  Handedness:Right   Mood and Affect  Mood:Euthymic  Affect:Appropriate; Congruent   Thought Process  Thought Processes:Coherent; Goal Directed  Descriptions of Associations:Intact  Orientation:Full (Time, Place and Person)  Thought Content:Logical (has improved since she is no longer intoxicated.)  History of Schizophrenia/Schizoaffective disorder:No  Duration of Psychotic Symptoms:No data recorded Hallucinations:Hallucinations: None  Ideas of Reference:None  Suicidal Thoughts:Suicidal Thoughts: No (has improved since she cleared up for alcohol intoxication)  Homicidal Thoughts:Homicidal Thoughts: No   Sensorium  Memory:Immediate Good; Recent Good; Remote Good  Judgment:-- (impulsive as evidenced by alcohol abuse)  Insight:Fair   Executive Functions  Concentration:Good  Attention  Span:Fair  Recall:Good  Fund of Knowledge:Good  Language:Good   Psychomotor Activity  Psychomotor Activity:Psychomotor Activity: Normal   Assets  Assets:Communication Skills; Desire for Improvement; Housing; Social Support; Vocational/Educational   Sleep  Sleep:Sleep: Good Number of Hours of Sleep: 7    Physical Exam: Physical Exam Vitals and nursing note reviewed.  Constitutional:      Appearance: Normal appearance.  Cardiovascular:     Rate and Rhythm: Normal rate.     Pulses: Normal pulses.  Pulmonary:     Effort: Pulmonary effort is normal.  Musculoskeletal:        General: Normal range of motion.     Cervical back: Normal range of motion.  Neurological:     Mental Status: She is alert and oriented to person, place, and time. Mental status is at baseline.  Psychiatric:        Attention and Perception: Attention and perception normal.        Mood and Affect: Mood and affect normal.        Speech: Speech normal.        Behavior: Behavior normal. Behavior is cooperative.        Thought Content: Thought content normal. Thought content is not paranoid or delusional. Thought content does not include homicidal or suicidal ideation. Thought content does not include homicidal or suicidal plan.  Cognition and Memory: Cognition normal.        Judgment: Judgment is impulsive (as evidenced by alcohol abuse).    Review of Systems  Constitutional: Negative.   HENT: Negative.    Eyes: Negative.   Respiratory: Negative.    Cardiovascular: Negative.   Gastrointestinal: Negative.   Genitourinary: Negative.   Musculoskeletal: Negative.   Skin: Negative.   Neurological: Negative.   Endo/Heme/Allergies: Negative.   Psychiatric/Behavioral:  Positive for substance abuse. Negative for hallucinations, memory loss and suicidal ideas. The patient is not nervous/anxious and does not have insomnia.    Blood pressure (!) 140/87, pulse 86, temperature 98.2 F (36.8 C),  temperature source Oral, resp. rate 19, height 5\' 7"  (1.702 m), weight 67.1 kg, SpO2 97 %. Body mass index is 23.17 kg/m.  Treatment Plan Summary: Pt presented with substance induced mood disorder; Had suicidal ideations but her presentation was complicated by alcohol intoxication.  Her admission BAL was 309. Since admission, patient has slept and has cleared up and cannot remember making threats to harm herself.  She is alert and oriented, clear and coherent and able to appropriately articulate her needs.  She denies suicidal or homicidal ideation, no plan to intent for self harm.  Pt has a history for alcohol abuse and varying periods of sobriety but no history for prior suicide attempts.  She has some psychosocial stressors but lives with a girlfriend whom she reports as supportive of her.  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 PT is future oriented and looking forward to starting work.  She is connected with AA and has a sponsor but currently not seeing anyone for outpatient therapy or med mgmt.    As per above, patient does not meet criteria for involuntary psychiatric admissions and states he is not interested in voluntary admissions.  She is psych cleared and recommended she follow up with outpatient resources added to discharge AVS.    We reviewed the importance of substance abuse abstinence; potential negative impact substance abuse can have on relationships and level of functioning.  Also reviewed the importance of medication compliance.   Disposition: No evidence of imminent risk to self or others at present.   Patient does not meet criteria for psychiatric inpatient admission. Supportive therapy provided about ongoing stressors. Discussed crisis plan, support from social network, calling 911, coming to the Emergency Department, and calling Suicide Hotline.  This service was provided via telemedicine using a 2-way, interactive  audio and video technology.  Names of all persons participating in this telemedicine service and their role in this encounter. Name: Actor Role: Patient  Name: Orvil Feil Role: PMHNP   Ophelia Shoulder, NP 06/25/2022 2:11 PM

## 2022-11-21 ENCOUNTER — Emergency Department (HOSPITAL_COMMUNITY)
Admission: EM | Admit: 2022-11-21 | Discharge: 2022-11-21 | Disposition: A | Discharge status: Home / Self Care | Payer: Self-pay | Attending: Emergency Medicine | Admitting: Emergency Medicine

## 2022-11-21 ENCOUNTER — Other Ambulatory Visit: Payer: Self-pay

## 2022-11-21 DIAGNOSIS — D72829 Elevated white blood cell count, unspecified: Secondary | ICD-10-CM | POA: Insufficient documentation

## 2022-11-21 DIAGNOSIS — F10930 Alcohol use, unspecified with withdrawal, uncomplicated: Secondary | ICD-10-CM

## 2022-11-21 DIAGNOSIS — F10230 Alcohol dependence with withdrawal, uncomplicated: Secondary | ICD-10-CM | POA: Insufficient documentation

## 2022-11-21 DIAGNOSIS — K2920 Alcoholic gastritis without bleeding: Secondary | ICD-10-CM | POA: Insufficient documentation

## 2022-11-21 DIAGNOSIS — E876 Hypokalemia: Secondary | ICD-10-CM | POA: Insufficient documentation

## 2022-11-21 LAB — URINALYSIS, ROUTINE W REFLEX MICROSCOPIC
Bacteria, UA: NONE SEEN
Bilirubin Urine: NEGATIVE
Glucose, UA: NEGATIVE mg/dL
Ketones, ur: 80 mg/dL — AB
Nitrite: NEGATIVE
Protein, ur: >=300 mg/dL — AB
Specific Gravity, Urine: 1.027 (ref 1.005–1.030)
pH: 5 (ref 5.0–8.0)

## 2022-11-21 LAB — COMPREHENSIVE METABOLIC PANEL
ALT: 18 U/L (ref 0–44)
AST: 25 U/L (ref 15–41)
Albumin: 5.3 g/dL — ABNORMAL HIGH (ref 3.5–5.0)
Alkaline Phosphatase: 78 U/L (ref 38–126)
Anion gap: 20 — ABNORMAL HIGH (ref 5–15)
BUN: 16 mg/dL (ref 6–20)
CO2: 19 mmol/L — ABNORMAL LOW (ref 22–32)
Calcium: 9.5 mg/dL (ref 8.9–10.3)
Chloride: 98 mmol/L (ref 98–111)
Creatinine, Ser: 0.93 mg/dL (ref 0.44–1.00)
GFR, Estimated: >60 mL/min (ref >60)
Glucose, Bld: 175 mg/dL — ABNORMAL HIGH (ref 70–99)
Potassium: 3 mmol/L — ABNORMAL LOW (ref 3.5–5.1)
Sodium: 137 mmol/L (ref 135–145)
Total Bilirubin: 1.4 mg/dL — ABNORMAL HIGH (ref 0.3–1.2)
Total Protein: 9 g/dL — ABNORMAL HIGH (ref 6.5–8.1)

## 2022-11-21 LAB — CBC
HCT: 45.9 % (ref 36.0–46.0)
Hemoglobin: 14.9 g/dL (ref 12.0–15.0)
MCH: 30.8 pg (ref 26.0–34.0)
MCHC: 32.5 g/dL (ref 30.0–36.0)
MCV: 95 fL (ref 80.0–100.0)
Platelets: 276 10*3/uL (ref 150–400)
RBC: 4.83 MIL/uL (ref 3.87–5.11)
RDW: 12.8 % (ref 11.5–15.5)
WBC: 16.6 10*3/uL — ABNORMAL HIGH (ref 4.0–10.5)
nRBC: 0 % (ref 0.0–0.2)

## 2022-11-21 LAB — LIPASE, BLOOD: Lipase: 24 U/L (ref 11–51)

## 2022-11-21 LAB — POC URINE PREG, ED: Preg Test, Ur: NEGATIVE

## 2022-11-21 MED ORDER — ONDANSETRON 4 MG PO TBDP: 4.0000 mg | ORAL_TABLET | Freq: Once | ORAL | Status: DC | PRN

## 2022-11-21 MED ORDER — LIDOCAINE VISCOUS HCL 2 % MT SOLN
15.0000 mL | Freq: Once | OROMUCOSAL | Status: AC
  Administered 2022-11-21: 15 mL via ORAL
  Filled 2022-11-21: qty 15

## 2022-11-21 MED ORDER — POTASSIUM CHLORIDE 20 MEQ PO PACK
60.0000 meq | PACK | Freq: Once | ORAL | Status: AC
  Administered 2022-11-21: 60 meq via ORAL
  Filled 2022-11-21: qty 3

## 2022-11-21 MED ORDER — ONDANSETRON HCL 4 MG PO TABS: 4.0000 mg | ORAL_TABLET | Freq: Four times a day (QID) | ORAL | 0 refills | Status: DC

## 2022-11-21 MED ORDER — ALUM & MAG HYDROXIDE-SIMETH 200-200-20 MG/5ML PO SUSP
30.0000 mL | Freq: Once | ORAL | Status: AC
  Administered 2022-11-21: 30 mL via ORAL
  Filled 2022-11-21: qty 30

## 2022-11-21 MED ORDER — PHENOBARBITAL SODIUM 130 MG/ML IJ SOLN
260.0000 mg | Freq: Once | INTRAMUSCULAR | Status: AC
  Administered 2022-11-21: 260 mg via INTRAVENOUS
  Filled 2022-11-21: qty 2

## 2022-11-21 MED ORDER — FAMOTIDINE 40 MG PO TABS: 40.0000 mg | ORAL_TABLET | Freq: Every day | ORAL | 0 refills | Status: AC

## 2022-11-21 MED ORDER — SODIUM CHLORIDE 0.9 % IV BOLUS
1000.0000 mL | Freq: Once | INTRAVENOUS | Status: AC
  Administered 2022-11-21: 1000 mL via INTRAVENOUS

## 2022-11-21 MED ORDER — ONDANSETRON HCL 4 MG/2ML IJ SOLN
4.0000 mg | Freq: Once | INTRAMUSCULAR | Status: AC | PRN
  Administered 2022-11-21: 4 mg via INTRAVENOUS
  Filled 2022-11-21: qty 2

## 2022-11-21 MED ORDER — PHENOBARBITAL SODIUM 130 MG/ML IJ SOLN
130.0000 mg | Freq: Once | INTRAMUSCULAR | Status: AC
  Administered 2022-11-21: 130 mg via INTRAVENOUS
  Filled 2022-11-21 (×2): qty 1

## 2022-11-21 MED ORDER — CHLORDIAZEPOXIDE HCL 25 MG PO CAPS: ORAL_CAPSULE | ORAL | 0 refills | Status: DC

## 2022-11-21 NOTE — Discharge Instructions (Addendum)
We evaluated you for your nausea and vomiting.  You had signs of alcohol withdrawal and improved with treatment.  I have prescribed you a medication called Librium.  If you start feeling withdrawal symptoms you can take this as prescribed.  I have also prescribed you nausea medications if you experience recurrent nausea.  I have attached some outpatient resources.  Please try to call around for availability.

## 2022-11-21 NOTE — ED Notes (Signed)
Awaiting house sup to bring the phenobarb.  Dr updated on that and verification of med

## 2022-11-21 NOTE — ED Provider Notes (Signed)
[facility] Provider Note  CSN: [id number] Arrival date & time: 11/21/22 1341  Chief Complaint(s) Alcohol Intoxication  HPI [name] is a 37 y.o. female history of alcohol use disorder, GERD presenting to the emergency department with nausea and vomiting.  Patient reports that she essentially drinks daily.  She had been drinking for the past 2 to 3 days, drank a lot of alcohol last night.  Has not drank anything today and having tremor, nausea and vomiting, abdominal pain, anxiety.  She reports that she has had alcohol withdrawal and this feels similar.  She reports that she experiences this sensation when she stops drinking alcohol normally.  She is interested in quitting drinking.  Denies injuries.  Reports abdominal pain is diffuse.  No hematemesis or bloody stools.  No fevers or chills.  No chest pain or shortness of breath.  No urinary symptoms.   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 Authorizing Provider  chlordiazePOXIDE (LIBRIUM) 25 MG capsule 50mg  PO TID x 1D, then 25-50mg  PO BID X 1D, then 25-50mg  PO QD X 1D 11/21/22  Yes Cristie Hem, MD  famotidine (PEPCID) 40 MG tablet Take 1 tablet (40 mg total) by mouth daily. 11/21/22  Yes Cristie Hem, MD  ondansetron (ZOFRAN) 4 MG tablet Take 1 tablet (4 mg total) by mouth every 6 (six) hours. 11/21/22  Yes Cristie Hem, MD  calcium carbonate (OS-CAL - DOSED IN MG OF ELEMENTAL CALCIUM) 1250 (500 Ca) MG tablet Take 1 tablet  (500 mg of elemental calcium total) by mouth daily with breakfast. Patient not taking: Reported on 12/23/2021 04/22/21   Barton Dubois, MD  esomeprazole (NEXIUM) 40 MG capsule Take 1 capsule (40 mg total) by mouth daily at 12 noon. Patient not taking: Reported on 12/23/2021 04/21/21 05/21/21  Barton Dubois, MD  levonorgestrel [facility]) 20 MCG/24HR IUD 1 each by Intrauterine route once.    [provider]  Multiple Vitamin (MULTIVITAMIN WITH MINERALS) TABS tablet Take 1 tablet by mouth daily. Patient not taking: Reported on 12/23/2021 04/22/21   Barton Dubois, MD  ondansetron (ZOFRAN-ODT) 8 MG disintegrating tablet Take 0.5 tablets (4 mg total) by mouth every 8 (eight) hours as needed. Patient not taking: Reported on 12/23/2021 04/21/21   Barton Dubois, MD  thiamine 100 MG tablet Take 1 tablet (100 mg total) by mouth daily. Patient not taking: Reported on 12/23/2021 04/22/21   Barton Dubois, MD  Past Surgical History No past surgical history on file. Family History No family history on file.  Social History Social History   Tobacco Use   Smoking status: Never   Smokeless tobacco: Never  Vaping Use   Vaping Use: Former  Substance Use Topics   Alcohol use: Yes    Alcohol/week: 2.0 standard drinks of alcohol    Types: 2 Glasses of wine per week   Drug use: No   Allergies Morphine and related  Review of Systems Review of Systems  All other systems reviewed and are negative.   Physical Exam Vital Signs  I have reviewed the triage vital signs BP (!) 105/58   Pulse 72   Temp 98.1 F (36.7 C) (Oral)   Resp (!) 25   Ht 5\' 7"  (1.702 m)   Wt 63.5 kg   LMP 10/21/2022   SpO2 97%   BMI 21.93 kg/m  Physical Exam Vitals and nursing note reviewed.  Constitutional:      General: She is not in acute distress.    Appearance: She is well-developed.  HENT:      Head: Normocephalic and atraumatic.     Mouth/Throat:     Mouth: Mucous membranes are dry.  Eyes:     Pupils: Pupils are equal, round, and reactive to light.  Cardiovascular:     Rate and Rhythm: Regular rhythm. Tachycardia present.     Heart sounds: No murmur heard. Pulmonary:     Effort: Pulmonary effort is normal. No respiratory distress.     Breath sounds: Normal breath sounds.  Abdominal:     General: Abdomen is flat.     Palpations: Abdomen is soft.     Tenderness: There is no abdominal tenderness.  Musculoskeletal:        General: No tenderness.     Right lower leg: No edema.     Left lower leg: No edema.  Skin:    General: Skin is warm and dry.  Neurological:     General: No focal deficit present.     Mental Status: She is alert. Mental status is at baseline.     Comments: Mild tremor present, tongue fasciculations present  Psychiatric:        Mood and Affect: Mood normal.        Behavior: Behavior normal.     ED Results and Treatments Labs (all labs ordered are listed, but only abnormal results are displayed) Labs Reviewed  COMPREHENSIVE METABOLIC PANEL - Abnormal; Notable for the following components:      Result Value   Potassium 3.0 (*)    CO2 19 (*)    Glucose, Bld 175 (*)    Total Protein 9.0 (*)    Albumin 5.3 (*)    Total Bilirubin 1.4 (*)    Anion gap 20 (*)    All other components within normal limits  CBC - Abnormal; Notable for the following components:   WBC 16.6 (*)    All other components within normal limits  URINALYSIS, ROUTINE W REFLEX MICROSCOPIC - Abnormal; Notable for the following components:   APPearance HAZY (*)    Hgb urine dipstick MODERATE (*)    Ketones, ur 80 (*)    Protein, ur >=300 (*)    Leukocytes,Ua TRACE (*)    All other components within normal limits  LIPASE, BLOOD  POC URINE PREG, ED  Radiology No results found.  Pertinent labs & imaging results that were available during my care of the patient were reviewed by me and considered in my medical decision making (see MDM for details).  Medications Ordered in ED Medications  ondansetron (ZOFRAN) injection 4 mg (4 mg Intravenous Given 11/21/22 1508)  sodium chloride 0.9 % bolus 1,000 mL (0 mLs Intravenous Stopped 11/21/22 1727)  PHENObarbital (LUMINAL) injection 260 mg (260 mg Intravenous Given 11/21/22 1612)  alum & mag hydroxide-simeth (MAALOX/MYLANTA) 200-200-20 MG/5ML suspension 30 mL (30 mLs Oral Given 11/21/22 1844)    And  lidocaine (XYLOCAINE) 2 % viscous mouth solution 15 mL (15 mLs Oral Given 11/21/22 1844)  PHENObarbital (LUMINAL) injection 130 mg (130 mg Intravenous Given 11/21/22 2022)  potassium chloride (KLOR-CON) packet 60 mEq (60 mEq Oral Given 11/21/22 1843)                                                                                                                                     Procedures Procedures  (including critical care time)  Medical Decision Making / ED Course   MDM:  37 year old female presenting to the emergency department with nausea and vomiting.  Patient uncomfortable appearing, not in any acute distress.  Exam with tremor and tongue fasciculations as well as tachycardia.  Suspect likely alcohol withdrawal related to stopping alcohol.  May also have some element of alcohol related gastritis given abdominal pain although no focal tenderness to suggest acute intra-abdominal process such as perforation, cholecystitis, pancreatitis. will check labs to evaluate including lipase.  Will give fluids, antinausea medication, phenobarbital to treat for suspected alcohol withdrawal.  Will reassess.  Clinical Course as of 11/22/22 1245  Tue Nov 21, 2022  2328 Tachycardia resolved after second dose of phenobarbital.  Patient feels much better and has been able to tolerate p.o.  Potassium repleted.   Provided resources for alcohol cessation.  Provided prescription for Librium. [WS]    Clinical Course User Index [WS] Cristie Hem, MD     Additional history obtained: -External records from outside source obtained and reviewed including: Chart review including previous notes, labs, imaging, consultation notes including prior admission for alcohol withdrawal   Lab Tests: -I ordered, reviewed, and interpreted labs.   The pertinent results include:   Labs Reviewed  COMPREHENSIVE METABOLIC PANEL - Abnormal; Notable for the following components:      Result Value   Potassium 3.0 (*)    CO2 19 (*)    Glucose, Bld 175 (*)    Total Protein 9.0 (*)    Albumin 5.3 (*)    Total Bilirubin 1.4 (*)    Anion gap 20 (*)    All other components within normal limits  CBC - Abnormal; Notable for the following components:   WBC 16.6 (*)    All other components within normal limits  URINALYSIS, ROUTINE W REFLEX MICROSCOPIC - Abnormal; Notable for the following components:  APPearance HAZY (*)    Hgb urine dipstick MODERATE (*)    Ketones, ur 80 (*)    Protein, ur >=300 (*)    Leukocytes,Ua TRACE (*)    All other components within normal limits  LIPASE, BLOOD  POC URINE PREG, ED    Notable for leukocytosis, likely reactive. Hypokalemia. Anion gap likely due to dehydration and ketosis from starvation  EKG   EKG Interpretation  Date/Time:  Tuesday November 21 2022 18:44:55 EDT Ventricular Rate:  99 PR Interval:  125 QRS Duration: 89 QT Interval:  362 QTC Calculation: 465 R Axis:   76 Text Interpretation: Sinus rhythm Confirmed by Garnette Gunner [phone]) on 11/21/2022 7:31:00 PM          Medicines ordered and prescription drug management: Meds ordered this encounter  Medications   DISCONTD: ondansetron (ZOFRAN-ODT) disintegrating tablet 4 mg   ondansetron (ZOFRAN) injection 4 mg   sodium chloride 0.9 % bolus 1,000 mL   PHENObarbital (LUMINAL) injection 260 mg   AND  Linked Order Group    alum & mag hydroxide-simeth (MAALOX/MYLANTA) 200-200-20 MG/5ML suspension 30 mL    lidocaine (XYLOCAINE) 2 % viscous mouth solution 15 mL   PHENObarbital (LUMINAL) injection 130 mg   potassium chloride (KLOR-CON) packet 60 mEq   chlordiazePOXIDE (LIBRIUM) 25 MG capsule    Sig: 50mg  PO TID x 1D, then 25-50mg  PO BID X 1D, then 25-50mg  PO QD X 1D    Dispense:  10 capsule    Refill:  0   ondansetron (ZOFRAN) 4 MG tablet    Sig: Take 1 tablet (4 mg total) by mouth every 6 (six) hours.    Dispense:  12 tablet    Refill:  0   famotidine (PEPCID) 40 MG tablet    Sig: Take 1 tablet (40 mg total) by mouth daily.    Dispense:  30 tablet    Refill:  0    -I have reviewed the patients home medicines and have made adjustments as needed   Cardiac Monitoring: The patient was maintained on a cardiac monitor.  I personally viewed and interpreted the cardiac monitored which showed an underlying rhythm of: sinus tachycardia  Social Determinants of Health:  Diagnosis or treatment significantly limited by social determinants of health: alcohol use   Reevaluation: After the interventions noted above, I reevaluated the patient and found that their symptoms have resolved  Co morbidities that complicate the patient evaluation  Past Medical History:  Diagnosis Date   Bartholin's cyst       Dispostion: Disposition decision including need for hospitalization was considered, and patient discharged from emergency department.    Final Clinical Impression(s) / ED Diagnoses Final diagnoses:  Alcohol withdrawal syndrome without complication  Acute alcoholic gastritis without hemorrhage     This chart was dictated using voice recognition software.  Despite best efforts to proofread,  errors can occur which can change the documentation meaning.    Cristie Hem, MD 11/22/22 1245

## 2022-11-21 NOTE — ED Notes (Addendum)
Pt given ginger ale w/ EDP permission.  Pt directed to take small sips.

## 2022-11-21 NOTE — ED Notes (Signed)
Pt requesting something to drink.  Pt informed we need to wait until she is no longer vomiting.

## 2022-11-21 NOTE — ED Notes (Signed)
Sitting up watching TV.  No further tremors.  Drinking sprite.  No further vomiting.   Pt states she wants rehab for her drinking problem    She stated she did notified Dr. Berneice Gandy this

## 2022-11-21 NOTE — ED Notes (Signed)
Stress to pt the importance of getting her  Rx's tomorrow and taking them as ordered.  If tremors return to go to nearest ER

## 2022-11-21 NOTE — ED Triage Notes (Signed)
Pt presents with ongoing N/V after drinking "a lot" of liquor drinks last PM.

## 2022-11-21 NOTE — ED Notes (Signed)
Pharm called to check on pt.  Pt fully alert sitting up on strecther talking on cell phone.  Does have tremors with hands held up.  Continues to have some vomiting.

## 2022-11-21 NOTE — ED Notes (Signed)
Pt up amb in room. Mom outside she states to pick her up

## 2022-11-23 ENCOUNTER — Encounter (HOSPITAL_COMMUNITY): Payer: Self-pay

## 2022-11-23 ENCOUNTER — Emergency Department (HOSPITAL_COMMUNITY)
Admission: EM | Admit: 2022-11-23 | Discharge: 2022-11-24 | Disposition: A | Discharge status: Home / Self Care | Payer: Self-pay | Attending: Emergency Medicine | Admitting: Emergency Medicine

## 2022-11-23 ENCOUNTER — Other Ambulatory Visit: Payer: Self-pay

## 2022-11-23 DIAGNOSIS — K2920 Alcoholic gastritis without bleeding: Secondary | ICD-10-CM

## 2022-11-23 DIAGNOSIS — R000 Tachycardia, unspecified: Secondary | ICD-10-CM | POA: Insufficient documentation

## 2022-11-23 MED ORDER — ONDANSETRON 8 MG PO TBDP
8.0000 mg | ORAL_TABLET | Freq: Once | ORAL | Status: AC
  Administered 2022-11-23: 8 mg via ORAL
  Filled 2022-11-23: qty 1

## 2022-11-23 NOTE — ED Triage Notes (Signed)
Pt BIB [facility] due to N/V, abdominal pain that began last night.   Pt seen here recently for the same. Pt denies ETOH, EMS reported pt told them she had "a handle" today.   Pt not giving much information in triage due to vomiting. When asked if this is different from recent episodes, pt states "it's all the same".

## 2022-11-24 LAB — CBC
HCT: 42.2 % (ref 36.0–46.0)
Hemoglobin: 14.2 g/dL (ref 12.0–15.0)
MCH: 31.3 pg (ref 26.0–34.0)
MCHC: 33.6 g/dL (ref 30.0–36.0)
MCV: 93.2 fL (ref 80.0–100.0)
Platelets: 268 10*3/uL (ref 150–400)
RBC: 4.53 MIL/uL (ref 3.87–5.11)
RDW: 12.5 % (ref 11.5–15.5)
WBC: 8.1 10*3/uL (ref 4.0–10.5)
nRBC: 0 % (ref 0.0–0.2)

## 2022-11-24 LAB — COMPREHENSIVE METABOLIC PANEL
ALT: 16 U/L (ref 0–44)
AST: 23 U/L (ref 15–41)
Albumin: 4.8 g/dL (ref 3.5–5.0)
Alkaline Phosphatase: 62 U/L (ref 38–126)
Anion gap: 13 (ref 5–15)
BUN: 9 mg/dL (ref 6–20)
CO2: 24 mmol/L (ref 22–32)
Calcium: 8.9 mg/dL (ref 8.9–10.3)
Chloride: 99 mmol/L (ref 98–111)
Creatinine, Ser: 0.67 mg/dL (ref 0.44–1.00)
GFR, Estimated: >60 mL/min (ref >60)
Glucose, Bld: 132 mg/dL — ABNORMAL HIGH (ref 70–99)
Potassium: 3 mmol/L — ABNORMAL LOW (ref 3.5–5.1)
Sodium: 136 mmol/L (ref 135–145)
Total Bilirubin: 0.3 mg/dL (ref 0.3–1.2)
Total Protein: 8.1 g/dL (ref 6.5–8.1)

## 2022-11-24 LAB — LIPASE, BLOOD: Lipase: 34 U/L (ref 11–51)

## 2022-11-24 MED ORDER — LACTATED RINGERS IV BOLUS
1000.0000 mL | Freq: Once | INTRAVENOUS | Status: AC
  Administered 2022-11-24: 1000 mL via INTRAVENOUS

## 2022-11-24 MED ORDER — FAMOTIDINE IN NACL 20-0.9 MG/50ML-% IV SOLN
20.0000 mg | Freq: Once | INTRAVENOUS | Status: AC
  Administered 2022-11-24: 20 mg via INTRAVENOUS
  Filled 2022-11-24: qty 50

## 2022-11-24 MED ORDER — HALOPERIDOL LACTATE 5 MG/ML IJ SOLN
5.0000 mg | Freq: Once | INTRAMUSCULAR | Status: AC
  Administered 2022-11-24: 5 mg via INTRAVENOUS
  Filled 2022-11-24: qty 1

## 2022-11-24 MED ORDER — SUCRALFATE 1 G PO TABS: 1.0000 g | ORAL_TABLET | Freq: Three times a day (TID) | ORAL | 0 refills | Status: DC

## 2022-11-24 NOTE — ED Notes (Signed)
Pt c/o nausea and stomach burning. Edp made aware

## 2022-11-24 NOTE — ED Provider Notes (Signed)
[place] [facility] Provider Note   CSN: [id number] Arrival date & time: 11/23/22  2010     History  Chief Complaint  Patient presents with   Abdominal Pain   Emesis    [name] is a 37 y.o. female.  Patient presents to the emergency department for evaluation of abdominal pain, nausea and vomiting.  Patient reports that she drank "too much".  She is complaining of burning pain in her stomach and vomiting.  She reports that she has had similar symptoms in the past.       Home Medications Prior to Admission medications   Medication Sig Start Date End Date Taking? Authorizing Provider  sucralfate (CARAFATE) 1 g tablet Take 1 tablet (1 g total) by mouth 4 (four) times daily -  with meals and at bedtime. 11/24/22  Yes Kodee Ravert, Canary Brimhristopher J, MD  famotidine (PEPCID) 40 MG tablet Take 1 tablet (40 mg total) by mouth daily. 11/21/22   Lonell GrandchildScheving, William L, MD  levonorgestrel (MIRENA) 20 MCG/24HR IUD 1 each by Intrauterine route once.    [provider]  ondansetron (ZOFRAN) 4 MG tablet Take 1 tablet (4 mg total) by mouth every 6 (six) hours. 11/21/22   Lonell GrandchildScheving, William L, MD      Allergies    Morphine and related    Review of Systems   Review of Systems  Physical Exam Updated Vital Signs BP (!) 152/89   Pulse 84   Temp 98.7 F (37.1 C) (Oral)   Resp 19   LMP 10/21/2022   SpO2 97%  Physical Exam Vitals and nursing note reviewed.  Constitutional:      General: She is in acute distress (actively vomiting).     Appearance: She is well-developed.  HENT:     Head: Normocephalic and atraumatic.     Mouth/Throat:     Mouth: Mucous membranes are moist.  Eyes:     General: Vision grossly intact. Gaze aligned appropriately.     Extraocular Movements: Extraocular movements intact.     Conjunctiva/sclera: Conjunctivae normal.  Cardiovascular:     Rate and Rhythm: Regular rhythm. Tachycardia present.     Pulses: Normal pulses.      Heart sounds: Normal heart sounds, S1 normal and S2 normal. No murmur heard.    No friction rub. No gallop.  Pulmonary:     Effort: Pulmonary effort is normal. No respiratory distress.     Breath sounds: Normal breath sounds.  Abdominal:     General: Bowel sounds are normal.     Palpations: Abdomen is soft.     Tenderness: There is generalized abdominal tenderness. There is no guarding or rebound.     Hernia: No hernia is present.  Musculoskeletal:        General: No swelling.     Cervical back: Full passive range of motion without pain, normal range of motion and neck supple. No spinous process tenderness or muscular tenderness. Normal range of motion.     Right lower leg: No edema.     Left lower leg: No edema.  Skin:    General: Skin is warm and dry.     Capillary Refill: Capillary refill takes less than 2 seconds.     Findings: No ecchymosis, erythema, rash or wound.  Neurological:     General: No focal deficit present.     Mental Status: She is alert and oriented to person, place, and time.     GCS: GCS eye  subscore is 4. GCS verbal subscore is 5. GCS motor subscore is 6.     Cranial Nerves: Cranial nerves 2-12 are intact.     Sensory: Sensation is intact.     Motor: Motor function is intact.     Coordination: Coordination is intact.  Psychiatric:        Attention and Perception: Attention normal.        Mood and Affect: Mood normal.        Speech: Speech normal.        Behavior: Behavior normal.     ED Results / Procedures / Treatments   Labs (all labs ordered are listed, but only abnormal results are displayed) Labs Reviewed  COMPREHENSIVE METABOLIC PANEL - Abnormal; Notable for the following components:      Result Value   Potassium 3.0 (*)    Glucose, Bld 132 (*)    All other components within normal limits  LIPASE, BLOOD  CBC  URINALYSIS, ROUTINE W REFLEX MICROSCOPIC  POC URINE PREG, ED    EKG None  Radiology No results found.  Procedures Procedures     Medications Ordered in ED Medications  ondansetron (ZOFRAN-ODT) disintegrating tablet 8 mg (8 mg Oral Given 11/23/22 2039)  lactated ringers bolus 1,000 mL (0 mLs Intravenous Stopped 11/24/22 0203)    Followed by  lactated ringers bolus 1,000 mL (0 mLs Intravenous Stopped 11/24/22 0203)  haloperidol lactate (HALDOL) injection 5 mg (5 mg Intravenous Given 11/24/22 0026)  famotidine (PEPCID) IVPB 20 mg premix (0 mg Intravenous Stopped 11/24/22 0100)    ED Course/ Medical Decision Making/ A&P                             Medical Decision Making Amount and/or Complexity of Data Reviewed External Data Reviewed: notes. Labs: ordered. Decision-making details documented in ED Course.  Risk Prescription drug management.   Presents to the emergency department for evaluation of nausea and vomiting with abdominal pain.  Records were reviewed.  Patient has numerous similar episodes related to acute alcohol intoxication and chronic alcohol abuse.  Patient actively vomiting on arrival.  She was given Zofran and Haldol and has improved.  Patient given IV fluids, no significant dehydration.  Labs are all entirely normal.  Abdominal exam is benign.  She does not require additional imaging.  She will be appropriate for discharge, treatment for alcoholic gastritis.        Final Clinical Impression(s) / ED Diagnoses Final diagnoses:  Acute alcoholic gastritis without hemorrhage    Rx / DC Orders ED Discharge Orders          Ordered    sucralfate (CARAFATE) 1 g tablet  3 times daily with meals & bedtime        11/24/22 0308              Gilda Crease, MD 11/24/22 [phone]

## 2022-11-24 NOTE — Discharge Instructions (Signed)
If you stop drinking alcohol you will not for this problem

## 2023-08-14 ENCOUNTER — Emergency Department (HOSPITAL_COMMUNITY)
Admission: EM | Admit: 2023-08-14 | Discharge: 2023-08-15 | Disposition: A | Discharge status: Home / Self Care | Payer: MEDICAID | Attending: Emergency Medicine | Admitting: Emergency Medicine

## 2023-08-14 ENCOUNTER — Other Ambulatory Visit: Payer: Self-pay

## 2023-08-14 DIAGNOSIS — E860 Dehydration: Secondary | ICD-10-CM | POA: Insufficient documentation

## 2023-08-14 DIAGNOSIS — R112 Nausea with vomiting, unspecified: Secondary | ICD-10-CM | POA: Diagnosis present

## 2023-08-14 DIAGNOSIS — R1084 Generalized abdominal pain: Secondary | ICD-10-CM | POA: Diagnosis not present

## 2023-08-14 MED ORDER — PROCHLORPERAZINE EDISYLATE 10 MG/2ML IJ SOLN
10.0000 mg | Freq: Once | INTRAMUSCULAR | Status: AC
  Administered 2023-08-15: 10 mg via INTRAVENOUS
  Filled 2023-08-14: qty 2

## 2023-08-14 MED ORDER — SODIUM CHLORIDE 0.9 % IV BOLUS
1000.0000 mL | Freq: Once | INTRAVENOUS | Status: AC
  Administered 2023-08-15: 1000 mL via INTRAVENOUS

## 2023-08-14 NOTE — ED Triage Notes (Signed)
Pt reports emesis that began today and lower abdominal pain. Pt reports "I think I have alcohol poisoning." Pt reports last drink was yesterday evening. Pt actively vomiting in triage.

## 2023-08-15 LAB — URINALYSIS, W/ REFLEX TO CULTURE (INFECTION SUSPECTED)
Bacteria, UA: NONE SEEN
Bilirubin Urine: NEGATIVE
Glucose, UA: 50 mg/dL — AB
Ketones, ur: 20 mg/dL — AB
Leukocytes,Ua: NEGATIVE
Nitrite: NEGATIVE
Protein, ur: NEGATIVE mg/dL
Specific Gravity, Urine: 1.014 (ref 1.005–1.030)
pH: 7 (ref 5.0–8.0)

## 2023-08-15 LAB — COMPREHENSIVE METABOLIC PANEL
ALT: 18 U/L (ref 0–44)
AST: 29 U/L (ref 15–41)
Albumin: 4.5 g/dL (ref 3.5–5.0)
Alkaline Phosphatase: 69 U/L (ref 38–126)
Anion gap: 16 — ABNORMAL HIGH (ref 5–15)
BUN: 9 mg/dL (ref 6–20)
CO2: 22 mmol/L (ref 22–32)
Calcium: 9 mg/dL (ref 8.9–10.3)
Chloride: 100 mmol/L (ref 98–111)
Creatinine, Ser: 0.6 mg/dL (ref 0.44–1.00)
GFR, Estimated: >60 mL/min (ref >60)
Glucose, Bld: 179 mg/dL — ABNORMAL HIGH (ref 70–99)
Potassium: 2.9 mmol/L — ABNORMAL LOW (ref 3.5–5.1)
Sodium: 138 mmol/L (ref 135–145)
Total Bilirubin: 0.8 mg/dL (ref <1.2)
Total Protein: 8.2 g/dL — ABNORMAL HIGH (ref 6.5–8.1)

## 2023-08-15 LAB — CBC WITH DIFFERENTIAL/PLATELET
Abs Immature Granulocytes: 0.06 10*3/uL (ref 0.00–0.07)
Basophils Absolute: 0 10*3/uL (ref 0.0–0.1)
Basophils Relative: 0 %
Eosinophils Absolute: 0 10*3/uL (ref 0.0–0.5)
Eosinophils Relative: 0 %
HCT: 40.7 % (ref 36.0–46.0)
Hemoglobin: 13.6 g/dL (ref 12.0–15.0)
Immature Granulocytes: 0 %
Lymphocytes Relative: 4 %
Lymphs Abs: 0.5 10*3/uL — ABNORMAL LOW (ref 0.7–4.0)
MCH: 33.8 pg (ref 26.0–34.0)
MCHC: 33.4 g/dL (ref 30.0–36.0)
MCV: 101.2 fL — ABNORMAL HIGH (ref 80.0–100.0)
Monocytes Absolute: 0.6 10*3/uL (ref 0.1–1.0)
Monocytes Relative: 5 %
Neutro Abs: 13.1 10*3/uL — ABNORMAL HIGH (ref 1.7–7.7)
Neutrophils Relative %: 91 %
Platelets: 222 10*3/uL (ref 150–400)
RBC: 4.02 MIL/uL (ref 3.87–5.11)
RDW: 13 % (ref 11.5–15.5)
WBC: 14.4 10*3/uL — ABNORMAL HIGH (ref 4.0–10.5)
nRBC: 0 % (ref 0.0–0.2)

## 2023-08-15 LAB — ETHANOL: Alcohol, Ethyl (B): <10 mg/dL (ref <10)

## 2023-08-15 LAB — HCG, SERUM, QUALITATIVE: Preg, Serum: NEGATIVE

## 2023-08-15 LAB — LIPASE, BLOOD: Lipase: 27 U/L (ref 11–51)

## 2023-08-15 MED ORDER — POTASSIUM CHLORIDE CRYS ER 20 MEQ PO TBCR
40.0000 meq | EXTENDED_RELEASE_TABLET | Freq: Once | ORAL | Status: AC
  Administered 2023-08-15: 40 meq via ORAL
  Filled 2023-08-15: qty 2

## 2023-08-15 MED ORDER — SUCRALFATE 1 GM/10ML PO SUSP
1.0000 g | Freq: Once | ORAL | Status: AC
  Administered 2023-08-15: 1 g via ORAL
  Filled 2023-08-15: qty 10

## 2023-08-15 MED ORDER — ONDANSETRON 4 MG PO TBDP: 4.0000 mg | ORAL_TABLET | Freq: Three times a day (TID) | ORAL | 0 refills | Status: DC | PRN

## 2023-08-15 MED ORDER — PANTOPRAZOLE SODIUM 40 MG IV SOLR
40.0000 mg | Freq: Once | INTRAVENOUS | Status: AC
  Administered 2023-08-15: 40 mg via INTRAVENOUS
  Filled 2023-08-15: qty 10

## 2023-08-15 MED ORDER — POTASSIUM CHLORIDE 10 MEQ/100ML IV SOLN
10.0000 meq | Freq: Once | INTRAVENOUS | Status: AC
  Administered 2023-08-15: 10 meq via INTRAVENOUS
  Filled 2023-08-15: qty 100

## 2023-08-15 MED ORDER — DROPERIDOL 2.5 MG/ML IJ SOLN
0.6250 mg | Freq: Once | INTRAMUSCULAR | Status: AC
  Administered 2023-08-15: 0.625 mg via INTRAVENOUS
  Filled 2023-08-15: qty 2

## 2023-08-15 MED ORDER — ONDANSETRON 4 MG PO TBDP: 4.0000 mg | ORAL_TABLET | Freq: Once | ORAL | Status: DC

## 2023-08-15 MED ORDER — ALUM & MAG HYDROXIDE-SIMETH 200-200-20 MG/5ML PO SUSP
30.0000 mL | Freq: Once | ORAL | Status: AC
  Administered 2023-08-15: 30 mL via ORAL
  Filled 2023-08-15: qty 30

## 2023-08-15 MED ORDER — LIDOCAINE VISCOUS HCL 2 % MT SOLN
15.0000 mL | Freq: Once | OROMUCOSAL | Status: AC
  Administered 2023-08-15: 15 mL via ORAL
  Filled 2023-08-15: qty 15

## 2023-08-15 MED ORDER — PANTOPRAZOLE SODIUM 20 MG PO TBEC: 20.0000 mg | DELAYED_RELEASE_TABLET | Freq: Every day | ORAL | 0 refills | Status: DC

## 2023-08-15 MED ORDER — SODIUM CHLORIDE 0.9 % IV BOLUS
500.0000 mL | Freq: Once | INTRAVENOUS | Status: AC
  Administered 2023-08-15: 500 mL via INTRAVENOUS

## 2023-08-15 NOTE — ED Provider Notes (Signed)
[place] EMERGENCY DEPARTMENT AT [facility] Provider Note  CSN: [id number] Arrival date & time: 08/14/23 2327  Chief Complaint(s) Emesis  HPI Calliana Hermansen is a 37 y.o. female with a past medical history listed below including alcohol use disorder, GERD here for generalized abdominal tenderness with several bouts of nausea and nonbloody nonbilious emesis.  Patient reports drinking malt liquor yesterday evening.  Reports she drinks most of the time.  Does admit to having gone through alcohol withdrawals previously.  Also reports use of marijuana last night.  Denies any urinary symptoms.  No diarrhea. No known sick contacts.  The history is provided by the patient.    Past Medical History Past Medical History:  Diagnosis Date   Bartholin's cyst    Patient Active Problem List   Diagnosis Date Noted   Alcohol-induced mood disorder (HCC) 06/24/2022   Gastroesophageal reflux disease    Intractable nausea and vomiting 04/20/2021   Nausea & vomiting 04/19/2021   Hypocalcemia 04/19/2021   Drug abuse (HCC) 04/19/2021   Alcohol withdrawal (HCC) 04/17/2021   Hypokalemia 04/17/2021   Home Medication(s) Prior to Admission medications   Medication Sig Start Date End Date Taking? Authorizing Provider  ondansetron (ZOFRAN-ODT) 4 MG disintegrating tablet Take 1 tablet (4 mg total) by mouth every 8 (eight) hours as needed for up to 3 days for nausea or vomiting. 08/15/23 08/18/23 Yes Corda Shutt, Amadeo Garnet, MD  pantoprazole (PROTONIX) 20 MG tablet Take 1 tablet (20 mg total) by mouth daily. 08/15/23  Yes Sentoria Brent, Amadeo Garnet, MD  famotidine (PEPCID) 40 MG tablet Take 1 tablet (40 mg total) by mouth daily. 11/21/22   Lonell Grandchild, MD  levonorgestrel (MIRENA) 20 MCG/24HR IUD 1 each by Intrauterine route once.    [provider]  ondansetron (ZOFRAN) 4 MG tablet Take 1 tablet (4 mg total) by mouth every 6 (six) hours. 11/21/22   Lonell Grandchild, MD  sucralfate  (CARAFATE) 1 g tablet Take 1 tablet (1 g total) by mouth 4 (four) times daily -  with meals and at bedtime. 11/24/22   Gilda Crease, MD                                                                                                                                    Allergies Morphine and codeine  Review of Systems Review of Systems As noted in HPI  Physical Exam Vital Signs  I have reviewed the triage vital signs BP 139/87 (BP Location: Right Arm)   Pulse 97   Temp 99 F (37.2 C) (Oral)   Resp 15   Ht 5\' 7"  (1.702 m)   Wt 63.5 kg   LMP 07/22/2023 (Approximate)   SpO2 97%   BMI 21.93 kg/m   Physical Exam Vitals reviewed.  Constitutional:      General: She is not in acute distress.    Appearance: She is well-developed. She is not diaphoretic.  HENT:     Head: Normocephalic and atraumatic.     Right Ear: External ear normal.     Left Ear: External ear normal.     Nose: Nose normal.  Eyes:     General: No scleral icterus.    Conjunctiva/sclera: Conjunctivae normal.  Neck:     Trachea: Phonation normal.  Cardiovascular:     Rate and Rhythm: Normal rate and regular rhythm.  Pulmonary:     Effort: Pulmonary effort is normal. No respiratory distress.     Breath sounds: No stridor.  Abdominal:     General: There is no distension.     Tenderness: There is abdominal tenderness in the epigastric area and left upper quadrant. There is no guarding or rebound.  Musculoskeletal:        General: Normal range of motion.     Cervical back: Normal range of motion.  Neurological:     Mental Status: She is alert and oriented to person, place, and time.  Psychiatric:        Behavior: Behavior normal.     ED Results and Treatments Labs (all labs ordered are listed, but only abnormal results are displayed) Labs Reviewed  COMPREHENSIVE METABOLIC PANEL - Abnormal; Notable for the following components:      Result Value   Potassium 2.9 (*)    Glucose, Bld 179 (*)     Total Protein 8.2 (*)    Anion gap 16 (*)    All other components within normal limits  CBC WITH DIFFERENTIAL/PLATELET - Abnormal; Notable for the following components:   WBC 14.4 (*)    MCV 101.2 (*)    Neutro Abs 13.1 (*)    Lymphs Abs 0.5 (*)    All other components within normal limits  URINALYSIS, W/ REFLEX TO CULTURE (INFECTION SUSPECTED) - Abnormal; Notable for the following components:   Glucose, UA 50 (*)    Hgb urine dipstick MODERATE (*)    Ketones, ur 20 (*)    All other components within normal limits  LIPASE, BLOOD  HCG, SERUM, QUALITATIVE  ETHANOL                                                                                                                         EKG  EKG Interpretation Date/Time:  Tuesday August 14 2023 23:40:04 EST Ventricular Rate:  122 PR Interval:  71 QRS Duration:  85 QT Interval:  313 QTC Calculation: 446 R Axis:   98  Text Interpretation: Sinus tachycardia Borderline right axis deviation Minimal ST depression, inferior leads Baseline wander in lead(s) I III aVL Confirmed by Drema Pry [phone]) on 08/15/2023 1:21:20 AM       Radiology No results found.  Medications Ordered in ED Medications  ondansetron (ZOFRAN-ODT) disintegrating tablet 4 mg (has no administration in time range)  sodium chloride 0.9 % bolus 1,000 mL (0 mLs Intravenous Stopped 08/15/23 0503)  prochlorperazine (COMPAZINE) injection 10 mg (10 mg Intravenous Given 08/15/23 0026)  pantoprazole (PROTONIX) injection 40 mg (40 mg Intravenous Given 08/15/23 0233)  alum & mag hydroxide-simeth (MAALOX/MYLANTA) 200-200-20 MG/5ML suspension 30 mL (30 mLs Oral Given 08/15/23 0244)    And  lidocaine (XYLOCAINE) 2 % viscous mouth solution 15 mL (15 mLs Oral Given 08/15/23 0244)  droperidol (INAPSINE) 2.5 MG/ML injection 0.625 mg (0.625 mg Intravenous Given 08/15/23 0229)  sucralfate (CARAFATE) 1 GM/10ML suspension 1 g (1 g Oral Given 08/15/23 0244)  potassium chloride 10  mEq in 100 mL IVPB (0 mEq Intravenous Stopped 08/15/23 0514)  potassium chloride SA (KLOR-CON M) CR tablet 40 mEq (40 mEq Oral Given 08/15/23 0503)  sodium chloride 0.9 % bolus 500 mL (500 mLs Intravenous New Bag/Given 08/15/23 0559)   Procedures Procedures  (including critical care time) Medical Decision Making / ED Course   Medical Decision Making Amount and/or Complexity of Data Reviewed Labs: ordered. Decision-making details documented in ED Course. ECG/medicine tests: ordered and independent interpretation performed. Decision-making details documented in ED Course.  Risk OTC drugs. Prescription drug management. Drug therapy requiring intensive monitoring for toxicity. Decision regarding hospitalization.    DDX considered.  Gen abd pain with N/V  Patient provided with IV fluids, antiemetics.  CBC with leukocytosis.  No anemia.  Leukocytosis may be secondary to demargination from stress induced.  Will reevaluate for potential intra-abdominal [place]/infectious process after the rest of the workup and initial treatment.  CMP with hypokalemia.  No other electrolyte derangements.  Hyperglycemia without DKA.  No renal sufficiency.  No evidence of bili obstruction or pancreatitis.  hCG negative ruling out pregnancy related process. EtOH negative  On reevaluation, patient reports feeling much better.  Abdominal exam is reassuring and patient only has mild discomfort to palpation in the epigastric and left upper quadrant region.  No lower abdominal tenderness or right upper quadrant tenderness concerning for acute cholecystitis or appendicitis.  Doubt serious intra-abdominal inflammatory/infectious process requiring CT imaging at this time.  Patient was provided with additional medication including droperidol, GI cocktail, and Protonix.  UA w/o evidence of infection.  5:57 AM Patient is feeling much better and able to tolerate PO. Oral hydration given and additional IVF for  orthostatic tachycardia up to 130s with sitting.  7:20 AM HR better. Ready to go.     Final Clinical Impression(s) / ED Diagnoses Final diagnoses:  Nausea and vomiting in adult  Dehydration  Generalized abdominal pain   The patient appears reasonably screened and/or stabilized for discharge and I doubt any other medical condition or other [facility] requiring further screening, evaluation, or treatment in the ED at this time. I have discussed the findings, Dx and Tx plan with the patient/family who expressed understanding and agree(s) with the plan. Discharge instructions discussed at length. The patient/family was given strict return precautions who verbalized understanding of the instructions. No further questions at time of discharge.  Disposition: Discharge  Condition: Good  ED Discharge Orders          Ordered    ondansetron (ZOFRAN-ODT) 4 MG disintegrating tablet  Every 8 hours PRN        08/15/23 0532    pantoprazole (PROTONIX) 20 MG tablet  Daily        08/15/23 0553             Follow Up: Primary care provider  Schedule an appointment as soon as possible for a visit    Primary care provider  Call  to schedule an appointment for close follow up    This chart  was dictated using voice recognition software.  Despite best efforts to proofread,  errors can occur which can change the documentation meaning.    Nira Conn, MD 08/15/23 [phone]

## 2023-08-16 ENCOUNTER — Encounter (HOSPITAL_COMMUNITY): Payer: Self-pay | Admitting: Emergency Medicine

## 2023-08-16 ENCOUNTER — Other Ambulatory Visit: Payer: Self-pay

## 2023-08-16 ENCOUNTER — Inpatient Hospital Stay (HOSPITAL_COMMUNITY)
Admission: EM | Admit: 2023-08-16 | Discharge: 2023-08-21 | DRG: 287 | Disposition: A | Discharge status: Home / Self Care | Payer: MEDICAID | Attending: Family Medicine | Admitting: Family Medicine

## 2023-08-16 DIAGNOSIS — Z975 Presence of (intrauterine) contraceptive device: Secondary | ICD-10-CM

## 2023-08-16 DIAGNOSIS — Z885 Allergy status to narcotic agent status: Secondary | ICD-10-CM

## 2023-08-16 DIAGNOSIS — E876 Hypokalemia: Secondary | ICD-10-CM | POA: Diagnosis present

## 2023-08-16 DIAGNOSIS — Z79899 Other long term (current) drug therapy: Secondary | ICD-10-CM

## 2023-08-16 DIAGNOSIS — R112 Nausea with vomiting, unspecified: Principal | ICD-10-CM | POA: Diagnosis present

## 2023-08-16 DIAGNOSIS — I959 Hypotension, unspecified: Secondary | ICD-10-CM | POA: Diagnosis not present

## 2023-08-16 DIAGNOSIS — I252 Old myocardial infarction: Secondary | ICD-10-CM

## 2023-08-16 DIAGNOSIS — R791 Abnormal coagulation profile: Secondary | ICD-10-CM | POA: Diagnosis present

## 2023-08-16 DIAGNOSIS — I429 Cardiomyopathy, unspecified: Secondary | ICD-10-CM

## 2023-08-16 DIAGNOSIS — K2920 Alcoholic gastritis without bleeding: Secondary | ICD-10-CM | POA: Diagnosis present

## 2023-08-16 DIAGNOSIS — R000 Tachycardia, unspecified: Secondary | ICD-10-CM | POA: Diagnosis present

## 2023-08-16 DIAGNOSIS — F10930 Alcohol use, unspecified with withdrawal, uncomplicated: Secondary | ICD-10-CM | POA: Diagnosis not present

## 2023-08-16 DIAGNOSIS — R7989 Other specified abnormal findings of blood chemistry: Secondary | ICD-10-CM | POA: Diagnosis present

## 2023-08-16 DIAGNOSIS — F1210 Cannabis abuse, uncomplicated: Secondary | ICD-10-CM | POA: Diagnosis present

## 2023-08-16 DIAGNOSIS — F1010 Alcohol abuse, uncomplicated: Secondary | ICD-10-CM | POA: Diagnosis present

## 2023-08-16 DIAGNOSIS — E871 Hypo-osmolality and hyponatremia: Secondary | ICD-10-CM | POA: Diagnosis present

## 2023-08-16 DIAGNOSIS — Z5982 Transportation insecurity: Secondary | ICD-10-CM

## 2023-08-16 DIAGNOSIS — I428 Other cardiomyopathies: Principal | ICD-10-CM | POA: Diagnosis present

## 2023-08-16 DIAGNOSIS — I160 Hypertensive urgency: Secondary | ICD-10-CM | POA: Diagnosis present

## 2023-08-16 DIAGNOSIS — I5A Non-ischemic myocardial injury (non-traumatic): Secondary | ICD-10-CM | POA: Diagnosis present

## 2023-08-16 DIAGNOSIS — D72829 Elevated white blood cell count, unspecified: Secondary | ICD-10-CM | POA: Diagnosis present

## 2023-08-16 DIAGNOSIS — F10939 Alcohol use, unspecified with withdrawal, unspecified: Secondary | ICD-10-CM | POA: Diagnosis present

## 2023-08-16 LAB — CBC WITH DIFFERENTIAL/PLATELET
Abs Immature Granulocytes: 0.05 10*3/uL (ref 0.00–0.07)
Basophils Absolute: 0 10*3/uL (ref 0.0–0.1)
Basophils Relative: 0 %
Eosinophils Absolute: 0 10*3/uL (ref 0.0–0.5)
Eosinophils Relative: 0 %
HCT: 47.2 % — ABNORMAL HIGH (ref 36.0–46.0)
Hemoglobin: 15.4 g/dL — ABNORMAL HIGH (ref 12.0–15.0)
Immature Granulocytes: 0 %
Lymphocytes Relative: 3 %
Lymphs Abs: 0.4 10*3/uL — ABNORMAL LOW (ref 0.7–4.0)
MCH: 32.7 pg (ref 26.0–34.0)
MCHC: 32.6 g/dL (ref 30.0–36.0)
MCV: 100.2 fL — ABNORMAL HIGH (ref 80.0–100.0)
Monocytes Absolute: 0.6 10*3/uL (ref 0.1–1.0)
Monocytes Relative: 5 %
Neutro Abs: 13.2 10*3/uL — ABNORMAL HIGH (ref 1.7–7.7)
Neutrophils Relative %: 92 %
Platelets: 276 10*3/uL (ref 150–400)
RBC: 4.71 MIL/uL (ref 3.87–5.11)
RDW: 12.9 % (ref 11.5–15.5)
WBC: 14.3 10*3/uL — ABNORMAL HIGH (ref 4.0–10.5)
nRBC: 0 % (ref 0.0–0.2)

## 2023-08-16 LAB — URINALYSIS, ROUTINE W REFLEX MICROSCOPIC
Glucose, UA: NEGATIVE mg/dL
Ketones, ur: 20 mg/dL — AB
Leukocytes,Ua: NEGATIVE
Nitrite: NEGATIVE
Protein, ur: >=300 mg/dL — AB
Specific Gravity, Urine: 1.038 — ABNORMAL HIGH (ref 1.005–1.030)
pH: 6 (ref 5.0–8.0)

## 2023-08-16 LAB — COMPREHENSIVE METABOLIC PANEL
ALT: 15 U/L (ref 0–44)
AST: 28 U/L (ref 15–41)
Albumin: 4.7 g/dL (ref 3.5–5.0)
Alkaline Phosphatase: 69 U/L (ref 38–126)
Anion gap: 13 (ref 5–15)
BUN: 10 mg/dL (ref 6–20)
CO2: 26 mmol/L (ref 22–32)
Calcium: 9 mg/dL (ref 8.9–10.3)
Chloride: 94 mmol/L — ABNORMAL LOW (ref 98–111)
Creatinine, Ser: 0.61 mg/dL (ref 0.44–1.00)
GFR, Estimated: >60 mL/min (ref >60)
Glucose, Bld: 149 mg/dL — ABNORMAL HIGH (ref 70–99)
Potassium: 2.6 mmol/L — CL (ref 3.5–5.1)
Sodium: 133 mmol/L — ABNORMAL LOW (ref 135–145)
Total Bilirubin: 1.4 mg/dL — ABNORMAL HIGH (ref <1.2)
Total Protein: 8.9 g/dL — ABNORMAL HIGH (ref 6.5–8.1)

## 2023-08-16 LAB — PREGNANCY, URINE: Preg Test, Ur: NEGATIVE

## 2023-08-16 LAB — ETHANOL: Alcohol, Ethyl (B): <10 mg/dL (ref <10)

## 2023-08-16 LAB — MAGNESIUM: Magnesium: 1.6 mg/dL — ABNORMAL LOW (ref 1.7–2.4)

## 2023-08-16 LAB — LIPASE, BLOOD: Lipase: 30 U/L (ref 11–51)

## 2023-08-16 LAB — TROPONIN I (HIGH SENSITIVITY)
Troponin I (High Sensitivity): 301 ng/L (ref <18)
Troponin I (High Sensitivity): 318 ng/L (ref <18)

## 2023-08-16 MED ORDER — METOCLOPRAMIDE HCL 5 MG/ML IJ SOLN
10.0000 mg | Freq: Once | INTRAMUSCULAR | Status: AC
  Administered 2023-08-16: 10 mg via INTRAVENOUS
  Filled 2023-08-16: qty 2

## 2023-08-16 MED ORDER — LORAZEPAM 2 MG/ML IJ SOLN
1.0000 mg | Freq: Once | INTRAMUSCULAR | Status: AC
  Administered 2023-08-16: 1 mg via INTRAVENOUS
  Filled 2023-08-16: qty 1

## 2023-08-16 MED ORDER — ONDANSETRON HCL 4 MG/2ML IJ SOLN
4.0000 mg | Freq: Four times a day (QID) | INTRAMUSCULAR | Status: DC | PRN
  Administered 2023-08-17 – 2023-08-19 (×6): 4 mg via INTRAVENOUS
  Filled 2023-08-16 (×6): qty 2

## 2023-08-16 MED ORDER — SODIUM CHLORIDE 0.9 % IV BOLUS
500.0000 mL | Freq: Once | INTRAVENOUS | Status: AC
  Administered 2023-08-16: 500 mL via INTRAVENOUS

## 2023-08-16 MED ORDER — LORAZEPAM 1 MG PO TABS
1.0000 mg | ORAL_TABLET | ORAL | Status: AC | PRN
  Administered 2023-08-17 – 2023-08-19 (×2): 1 mg via ORAL
  Filled 2023-08-16 (×2): qty 1

## 2023-08-16 MED ORDER — ACETAMINOPHEN 325 MG PO TABS
650.0000 mg | ORAL_TABLET | ORAL | Status: AC | PRN
Start: 2023-08-16 — End: ?
  Filled 2023-08-16: qty 2

## 2023-08-16 MED ORDER — SODIUM CHLORIDE 0.9 % IV BOLUS
1000.0000 mL | Freq: Once | INTRAVENOUS | Status: AC
  Administered 2023-08-16: 1000 mL via INTRAVENOUS

## 2023-08-16 MED ORDER — LORAZEPAM 2 MG/ML IJ SOLN
1.0000 mg | INTRAMUSCULAR | Status: AC | PRN
  Administered 2023-08-17 (×3): 1 mg via INTRAVENOUS
  Administered 2023-08-17: 2 mg via INTRAVENOUS
  Administered 2023-08-17: 1 mg via INTRAVENOUS
  Administered 2023-08-18: 2 mg via INTRAVENOUS
  Administered 2023-08-18: 1 mg via INTRAVENOUS
  Administered 2023-08-19 (×2): 2 mg via INTRAVENOUS
  Filled 2023-08-16 (×9): qty 1

## 2023-08-16 MED ORDER — THIAMINE MONONITRATE 100 MG PO TABS
100.0000 mg | ORAL_TABLET | Freq: Every day | ORAL | Status: DC
  Administered 2023-08-19 – 2023-08-21 (×3): 100 mg via ORAL
  Filled 2023-08-16 (×4): qty 1

## 2023-08-16 MED ORDER — THIAMINE HCL 100 MG/ML IJ SOLN
100.0000 mg | Freq: Every day | INTRAMUSCULAR | Status: DC
  Administered 2023-08-17 – 2023-08-18 (×2): 100 mg via INTRAVENOUS
  Filled 2023-08-16 (×2): qty 2

## 2023-08-16 MED ORDER — ENOXAPARIN SODIUM 40 MG/0.4ML IJ SOSY
40.0000 mg | PREFILLED_SYRINGE | INTRAMUSCULAR | Status: DC
  Administered 2023-08-17 – 2023-08-19 (×3): 40 mg via SUBCUTANEOUS
  Filled 2023-08-16 (×3): qty 0.4

## 2023-08-16 MED ORDER — FOLIC ACID 1 MG PO TABS
1.0000 mg | ORAL_TABLET | Freq: Every day | ORAL | Status: DC
  Filled 2023-08-16 (×2): qty 1

## 2023-08-16 MED ORDER — ADULT MULTIVITAMIN W/MINERALS CH
1.0000 | ORAL_TABLET | Freq: Every day | ORAL | Status: DC
  Administered 2023-08-20 – 2023-08-21 (×2): 1 via ORAL
  Filled 2023-08-16 (×5): qty 1

## 2023-08-16 MED ORDER — POTASSIUM CHLORIDE 10 MEQ/100ML IV SOLN
10.0000 meq | INTRAVENOUS | Status: AC
  Administered 2023-08-16 (×3): 10 meq via INTRAVENOUS
  Filled 2023-08-16 (×3): qty 100

## 2023-08-16 MED ORDER — POTASSIUM CHLORIDE 10 MEQ/100ML IV SOLN
10.0000 meq | INTRAVENOUS | Status: AC
  Administered 2023-08-16 – 2023-08-17 (×4): 10 meq via INTRAVENOUS
  Filled 2023-08-16 (×4): qty 100

## 2023-08-16 NOTE — Assessment & Plan Note (Addendum)
Does not appear to be in alcohol withdrawal.  No history of complicated withdrawal - Continue thiamine and folate - Continue CIWA scoring

## 2023-08-16 NOTE — Assessment & Plan Note (Addendum)
THC use is fairly rare, alcohol use or heavy.  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

## 2023-08-16 NOTE — H&P (Signed)
History and Physical    Patient: [name] KVQ:[id number] DOB: 1985/12/01 DOA: 08/16/2023 DOS: the patient was seen and examined on 08/16/2023 PCP: Patient, No Pcp Per  Patient coming from: Home  Chief Complaint:  Chief Complaint  Patient presents with   Abdominal Pain   HPI: [name] is a 37 y.o. female with medical history significant of ongoing EtOH and THC abuse.  Pt in to ED for 2nd time in 24h with recurrent N/V and abd pain, same as on 12/24.  Symptoms similar to prior episodes of either THC cyclic vomiting and/or EtOH gastritis.  In ED: some peaked T waves, K low at 2.6.  Concerning though is that her trop is 301.  LFTs and Lipase nl.    Review of Systems: As mentioned in the history of present illness. All other systems reviewed and are negative. Past Medical History:  Diagnosis Date   Bartholin's cyst    History reviewed. No pertinent surgical history. Social History:  reports that she has never smoked. She has never used smokeless tobacco. She reports current alcohol use of about 2.0 standard drinks of alcohol per week. She reports that she does not use drugs.  Allergies  Allergen Reactions   Morphine And Codeine Swelling    Arm swelling     History reviewed. No pertinent family history.  Prior to Admission medications   Medication Sig Start Date End Date Taking? Authorizing Provider  famotidine (PEPCID) 40 MG tablet Take 1 tablet (40 mg total) by mouth daily. 11/21/22   Lonell Grandchild, MD  levonorgestrel (MIRENA) 20 MCG/24HR IUD 1 each by Intrauterine route once.    [provider]  ondansetron (ZOFRAN) 4 MG tablet Take 1 tablet (4 mg total) by mouth every 6 (six) hours. 11/21/22   Lonell Grandchild, MD  ondansetron (ZOFRAN-ODT) 4 MG disintegrating tablet Take 1 tablet (4 mg total) by mouth every 8 (eight) hours as needed for up to 3 days for nausea or vomiting. 08/15/23 08/18/23  Nira Conn, MD  pantoprazole (PROTONIX) 20 MG  tablet Take 1 tablet (20 mg total) by mouth daily. 08/15/23   Cardama, Amadeo Garnet, MD  sucralfate (CARAFATE) 1 g tablet Take 1 tablet (1 g total) by mouth 4 (four) times daily -  with meals and at bedtime. 11/24/22   Gilda Crease, MD    Physical Exam: Vitals:   08/16/23 2215 08/16/23 2230 08/16/23 2245 08/16/23 2249  BP: 97/69 94/63 102/69 102/69  Pulse: 85 84 87 87  Resp: 15 18 19    Temp:      TempSrc:      SpO2: 98% 96% 97%   Weight:      Height:       Constitutional: NAD, calm, comfortable Respiratory: clear to auscultation bilaterally, no wheezing, no crackles. Normal respiratory effort. No accessory muscle use.  Cardiovascular: Regular rate and rhythm, no murmurs / rubs / gallops. No extremity edema. 2+ pedal pulses. No carotid bruits.  Abdomen: no tenderness, no masses palpated. No hepatosplenomegaly. Bowel sounds positive.  Neurologic: CN 2-12 grossly intact. Sensation intact, DTR normal. Strength 5/5 in all 4.  Psychiatric: Normal judgment and insight. Alert and oriented x 3. Normal mood.   Data Reviewed:    Labs on Admission: I have personally reviewed following labs and imaging studies  CBC: Recent Labs  Lab 08/15/23 0100 08/16/23 1629  WBC 14.4* 14.3*  NEUTROABS 13.1* 13.2*  HGB 13.6 15.4*  HCT 40.7 47.2*  MCV 101.2* 100.2*  PLT  222 276   Basic Metabolic Panel: Recent Labs  Lab 08/15/23 0100 08/16/23 1629 08/16/23 2140  NA 138 133*  --   K 2.9* 2.6*  --   CL 100 94*  --   CO2 22 26  --   GLUCOSE 179* 149*  --   BUN 9 10  --   CREATININE 0.60 0.61  --   CALCIUM 9.0 9.0  --   MG  --   --  1.6*   GFR: Estimated Creatinine Clearance: 93.6 mL/min (by C-G formula based on SCr of 0.61 mg/dL). Liver Function Tests: Recent Labs  Lab 08/15/23 0100 08/16/23 1629  AST 29 28  ALT 18 15  ALKPHOS 69 69  BILITOT 0.8 1.4*  PROT 8.2* 8.9*  ALBUMIN 4.5 4.7   Recent Labs  Lab 08/15/23 0100 08/16/23 1629  LIPASE 27 30   No results for  input(s): "AMMONIA" in the last 168 hours. Coagulation Profile: No results for input(s): "INR", "PROTIME" in the last 168 hours. Cardiac Enzymes: No results for input(s): "CKTOTAL", "CKMB", "CKMBINDEX", "TROPONINI" in the last 168 hours. BNP (last 3 results) No results for input(s): "PROBNP" in the last 8760 hours. HbA1C: No results for input(s): "HGBA1C" in the last 72 hours. CBG: No results for input(s): "GLUCAP" in the last 168 hours. Lipid Profile: No results for input(s): "CHOL", "HDL", "LDLCALC", "TRIG", "CHOLHDL", "LDLDIRECT" in the last 72 hours. Thyroid Function Tests: No results for input(s): "TSH", "T4TOTAL", "FREET4", "T3FREE", "THYROIDAB" in the last 72 hours. Anemia Panel: No results for input(s): "VITAMINB12", "FOLATE", "FERRITIN", "TIBC", "IRON", "RETICCTPCT" in the last 72 hours. Urine analysis:    Component Value Date/Time   COLORURINE YELLOW 08/15/2023 0506   APPEARANCEUR CLEAR 08/15/2023 0506   LABSPEC 1.014 08/15/2023 0506   PHURINE 7.0 08/15/2023 0506   GLUCOSEU 50 (A) 08/15/2023 0506   HGBUR MODERATE (A) 08/15/2023 0506   BILIRUBINUR NEGATIVE 08/15/2023 0506   KETONESUR 20 (A) 08/15/2023 0506   PROTEINUR NEGATIVE 08/15/2023 0506   UROBILINOGEN 0.2 03/02/2009 1737   NITRITE NEGATIVE 08/15/2023 0506   LEUKOCYTESUR NEGATIVE 08/15/2023 0506    Radiological Exams on Admission: No results found.  EKG: Independently reviewed.   Assessment and Plan: * Troponin level elevated Pt with N/V, abd pain, and elevated trop. Will call unstable angina till proven otherwise CP obs pathway NPO Tele monitor Admitting pt to [facility] Cards consulted by EDP.  Nausea & vomiting DDx broad, includes unstable angina, canabinoid hyperemesis syndrome, alcoholic gastritis. NPO IVF PRN zofran  Alcohol withdrawal (HCC) CIWA for withdrawals.      Advance Care Planning:   Code Status: Full Code  Consults: EDP d/w cards  Family Communication: No family in  room  Severity of Illness: The appropriate patient status for this patient is OBSERVATION. Observation status is judged to be reasonable and necessary in order to provide the required intensity of service to ensure the patient's safety. The patient's presenting symptoms, physical exam findings, and initial radiographic and laboratory data in the context of their medical condition is felt to place them at decreased risk for further clinical deterioration. Furthermore, it is anticipated that the patient will be medically stable for discharge from the hospital within 2 midnights of admission.   Author: Hillary Bow., DO 08/16/2023 10:58 PM  For on call review [email or web address].

## 2023-08-16 NOTE — Assessment & Plan Note (Addendum)
Myocardial injury Admitted for vomiting, found incidentally to have troponin leak.  Follow-up echocardiogram shows EF 45 to 50%, regional wall motion abnormalities.  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 - Start metoprolol - Continue aspirin -Consult cardiology, appreciate recommendations, plan for angiography on Monday

## 2023-08-16 NOTE — ED Provider Triage Note (Signed)
Emergency Medicine Provider Triage Evaluation Note  Virtie Miskiewicz , a 37 y.o. female  was evaluated in triage.  Pt complains of alcohol withdrawals, nausea and vomiting.  Was seen here yesterday for similar complaints that have since worsened.  Review of Systems  Positive:  Negative:   Physical Exam  BP (!) 133/113 (BP Location: Right Arm)   Pulse (!) 125   Temp 98.6 F (37 C) (Oral)   Resp 17   Ht 5\' 7"  (1.702 m)   Wt 63.5 kg   LMP 07/22/2023 (Approximate)   SpO2 100%   BMI 21.93 kg/m  Gen:   Awake,  Resp:  Normal effort  MSK:   Moves extremities without difficulty  Abd:  Nonfocal  Neuro:  Tongue fasciculations present  Medical Decision Making  Medically screening exam initiated at 3:57 PM.  Appropriate orders placed.  Rilda Schwabe was informed that the remainder of the evaluation will be completed by another provider, this initial triage assessment does not replace that evaluation, and the importance of remaining in the ED until their evaluation is complete.     Halford Decamp, PA-C 08/16/23 1610

## 2023-08-16 NOTE — ED Triage Notes (Addendum)
Patient BIB EMS from home c/o abdominal pain x4 days. Patient report worsening abdominal pain this afternoon. Patient was seen here yesteday with same complain. Patient report no relief.  Patient had 4mg  IV Zofran and NS by EMS.

## 2023-08-16 NOTE — ED Provider Notes (Incomplete)
[place] EMERGENCY DEPARTMENT AT [facility] Provider Note   CSN: [id number] Arrival date & time: 08/16/23  1537     History {Add pertinent medical, surgical, social history, OB history to HPI:1} Chief Complaint  Patient presents with   Abdominal Pain    [name] is a 37 y.o. female with h/o EtOH use, EtOH withdrawal, who presents with ***.  Presents with recurrent nausea/vomiting and abdominal pain, same as when had come on 12/24 but now worse. +F/c.    Pt complains of alcohol withdrawals, nausea and vomiting.  Was seen here yesterday for similar complaints that have since worsened. Thinks she may be withdrawing from alcohol. Last drink was 4-5 days ago. Used to drink 6-8 malt beverages per day. Also endorses THC use.    Past Medical History:  Diagnosis Date   Bartholin's cyst        Home Medications Prior to Admission medications   Medication Sig Start Date End Date Taking? Authorizing Provider  famotidine (PEPCID) 40 MG tablet Take 1 tablet (40 mg total) by mouth daily. 11/21/22   Lonell Grandchild, MD  levonorgestrel (MIRENA) 20 MCG/24HR IUD 1 each by Intrauterine route once.    [provider]  ondansetron (ZOFRAN) 4 MG tablet Take 1 tablet (4 mg total) by mouth every 6 (six) hours. 11/21/22   Lonell Grandchild, MD  ondansetron (ZOFRAN-ODT) 4 MG disintegrating tablet Take 1 tablet (4 mg total) by mouth every 8 (eight) hours as needed for up to 3 days for nausea or vomiting. 08/15/23 08/18/23  Nira Conn, MD  pantoprazole (PROTONIX) 20 MG tablet Take 1 tablet (20 mg total) by mouth daily. 08/15/23   Cardama, Amadeo Garnet, MD  sucralfate (CARAFATE) 1 g tablet Take 1 tablet (1 g total) by mouth 4 (four) times daily -  with meals and at bedtime. 11/24/22   Gilda Crease, MD      Allergies    Morphine and codeine    Review of Systems   Review of Systems A 10 point review of systems was performed and is negative unless  otherwise reported in HPI.  Physical Exam Updated Vital Signs BP (!) 152/111   Pulse (!) 124   Temp 98.6 F (37 C) (Oral)   Resp 20   Ht 5\' 7"  (1.702 m)   Wt 63.5 kg   LMP 07/22/2023 (Approximate)   SpO2 100%   BMI 21.93 kg/m  Physical Exam General: Normal appearing {Desc; female/female:11659}, lying in bed.  HEENT: PERRLA, Sclera anicteric, MMM, trachea midline.  Cardiology: RRR, no murmurs/rubs/gallops. BL radial and DP pulses equal bilaterally.  Resp: Normal respiratory rate and effort. CTAB, no wheezes, rhonchi, crackles.  Abd: Soft, non-tender, non-distended. No rebound tenderness or guarding.  GU: Deferred. MSK: No peripheral edema or signs of trauma. Extremities without deformity or TTP. No cyanosis or clubbing. Skin: warm, dry. No rashes or lesions. Back: No CVA tenderness Neuro: A&Ox4, CNs II-XII grossly intact. MAEs. Sensation grossly intact.  Psych: Normal mood and affect.   ED Results / Procedures / Treatments   Labs (all labs ordered are listed, but only abnormal results are displayed) Labs Reviewed  CBC WITH DIFFERENTIAL/PLATELET - Abnormal; Notable for the following components:      Result Value   WBC 14.3 (*)    Hemoglobin 15.4 (*)    HCT 47.2 (*)    MCV 100.2 (*)    Neutro Abs 13.2 (*)    Lymphs Abs 0.4 (*)  All other components within normal limits  COMPREHENSIVE METABOLIC PANEL - Abnormal; Notable for the following components:   Sodium 133 (*)    Potassium 2.6 (*)    Chloride 94 (*)    Glucose, Bld 149 (*)    Total Protein 8.9 (*)    Total Bilirubin 1.4 (*)    All other components within normal limits  LIPASE, BLOOD  ETHANOL  URINALYSIS, ROUTINE W REFLEX MICROSCOPIC  PREGNANCY, URINE    EKG None  Radiology No results found.  Procedures Procedures  {Document cardiac monitor, telemetry assessment procedure when appropriate:1}  Medications Ordered in ED Medications  metoCLOPramide (REGLAN) injection 10 mg (has no administration in  time range)  sodium chloride 0.9 % bolus 500 mL (has no administration in time range)    ED Course/ Medical Decision Making/ A&P                          Medical Decision Making Amount and/or Complexity of Data Reviewed Labs:  Decision-making details documented in ED Course.  Risk Prescription drug management.    This patient presents to the ED for concern of ***, this involves an extensive number of treatment options, and is a complaint that carries with it a high risk of complications and morbidity.  I considered the following differential and admission for this acute, potentially life threatening condition.   MDM:    ***  Clinical Course as of 08/16/23 1739  Thu Aug 16, 2023  1713 WBC(!): 14.3 +leukocytosis w/ left shift [HN]  1738 Potassium(!!): 2.6 +Hypokalemia d/t GI losses, will replete IV as is currently vomiting [HN]    Clinical Course User Index [HN] Loetta Rough, MD    Labs: I Ordered, and personally interpreted labs.  The pertinent results include:  ***  Imaging Studies ordered: I ordered imaging studies including *** I independently visualized and interpreted imaging. I agree with the radiologist interpretation  Additional history obtained from ***.  External records from outside source obtained and reviewed including ***  Cardiac Monitoring: The patient was maintained on a cardiac monitor.  I personally viewed and interpreted the cardiac monitored which showed an underlying rhythm of: ***  Reevaluation: After the interventions noted above, I reevaluated the patient and found that they have :{resolved/improved/worsened:23923::"improved"}  Social Determinants of Health: ***  Disposition:  ***  Co morbidities that complicate the patient evaluation  Past Medical History:  Diagnosis Date   Bartholin's cyst      Medicines Meds ordered this encounter  Medications   metoCLOPramide (REGLAN) injection 10 mg   sodium chloride 0.9 % bolus 500 mL     I have reviewed the patients home medicines and have made adjustments as needed  Problem List / ED Course: Problem List Items Addressed This Visit   None        {Document critical care time when appropriate:1} {Document review of labs and clinical decision tools ie heart score, Chads2Vasc2 etc:1}  {Document your independent review of radiology images, and any outside records:1} {Document your discussion with family members, caretakers, and with consultants:1} {Document social determinants of health affecting pt's care:1} {Document your decision making why or why not admission, treatments were needed:1}  This note was created using dictation software, which may contain spelling or grammatical errors.

## 2023-08-17 ENCOUNTER — Observation Stay (HOSPITAL_COMMUNITY): Payer: MEDICAID

## 2023-08-17 ENCOUNTER — Encounter (HOSPITAL_COMMUNITY): Payer: Self-pay | Admitting: Family Medicine

## 2023-08-17 DIAGNOSIS — I5021 Acute systolic (congestive) heart failure: Secondary | ICD-10-CM | POA: Diagnosis not present

## 2023-08-17 DIAGNOSIS — I252 Old myocardial infarction: Secondary | ICD-10-CM | POA: Diagnosis not present

## 2023-08-17 DIAGNOSIS — I160 Hypertensive urgency: Secondary | ICD-10-CM | POA: Diagnosis present

## 2023-08-17 DIAGNOSIS — R7989 Other specified abnormal findings of blood chemistry: Secondary | ICD-10-CM

## 2023-08-17 DIAGNOSIS — R000 Tachycardia, unspecified: Secondary | ICD-10-CM | POA: Diagnosis present

## 2023-08-17 DIAGNOSIS — I428 Other cardiomyopathies: Secondary | ICD-10-CM | POA: Diagnosis present

## 2023-08-17 DIAGNOSIS — E871 Hypo-osmolality and hyponatremia: Secondary | ICD-10-CM | POA: Diagnosis present

## 2023-08-17 DIAGNOSIS — E876 Hypokalemia: Secondary | ICD-10-CM | POA: Diagnosis present

## 2023-08-17 DIAGNOSIS — I2089 Other forms of angina pectoris: Secondary | ICD-10-CM

## 2023-08-17 DIAGNOSIS — I5A Non-ischemic myocardial injury (non-traumatic): Secondary | ICD-10-CM | POA: Diagnosis present

## 2023-08-17 DIAGNOSIS — F10939 Alcohol use, unspecified with withdrawal, unspecified: Secondary | ICD-10-CM | POA: Diagnosis not present

## 2023-08-17 DIAGNOSIS — F1210 Cannabis abuse, uncomplicated: Secondary | ICD-10-CM | POA: Diagnosis present

## 2023-08-17 DIAGNOSIS — R791 Abnormal coagulation profile: Secondary | ICD-10-CM | POA: Diagnosis present

## 2023-08-17 DIAGNOSIS — R112 Nausea with vomiting, unspecified: Secondary | ICD-10-CM | POA: Diagnosis present

## 2023-08-17 DIAGNOSIS — I959 Hypotension, unspecified: Secondary | ICD-10-CM | POA: Diagnosis not present

## 2023-08-17 DIAGNOSIS — F10930 Alcohol use, unspecified with withdrawal, uncomplicated: Secondary | ICD-10-CM | POA: Diagnosis not present

## 2023-08-17 DIAGNOSIS — Z5982 Transportation insecurity: Secondary | ICD-10-CM | POA: Diagnosis not present

## 2023-08-17 DIAGNOSIS — D72829 Elevated white blood cell count, unspecified: Secondary | ICD-10-CM | POA: Diagnosis present

## 2023-08-17 DIAGNOSIS — I10 Essential (primary) hypertension: Secondary | ICD-10-CM | POA: Diagnosis not present

## 2023-08-17 DIAGNOSIS — Z79899 Other long term (current) drug therapy: Secondary | ICD-10-CM | POA: Diagnosis not present

## 2023-08-17 DIAGNOSIS — I429 Cardiomyopathy, unspecified: Secondary | ICD-10-CM | POA: Diagnosis not present

## 2023-08-17 DIAGNOSIS — K2920 Alcoholic gastritis without bleeding: Secondary | ICD-10-CM | POA: Diagnosis present

## 2023-08-17 DIAGNOSIS — Z885 Allergy status to narcotic agent status: Secondary | ICD-10-CM | POA: Diagnosis not present

## 2023-08-17 DIAGNOSIS — Z975 Presence of (intrauterine) contraceptive device: Secondary | ICD-10-CM | POA: Diagnosis not present

## 2023-08-17 DIAGNOSIS — F1010 Alcohol abuse, uncomplicated: Secondary | ICD-10-CM | POA: Diagnosis present

## 2023-08-17 LAB — CBC WITH DIFFERENTIAL/PLATELET
Abs Immature Granulocytes: 0.03 10*3/uL (ref 0.00–0.07)
Basophils Absolute: 0 10*3/uL (ref 0.0–0.1)
Basophils Relative: 0 %
Eosinophils Absolute: 0 10*3/uL (ref 0.0–0.5)
Eosinophils Relative: 0 %
HCT: 39.9 % (ref 36.0–46.0)
Hemoglobin: 13.2 g/dL (ref 12.0–15.0)
Immature Granulocytes: 0 %
Lymphocytes Relative: 8 %
Lymphs Abs: 0.7 10*3/uL (ref 0.7–4.0)
MCH: 33.6 pg (ref 26.0–34.0)
MCHC: 33.1 g/dL (ref 30.0–36.0)
MCV: 101.5 fL — ABNORMAL HIGH (ref 80.0–100.0)
Monocytes Absolute: 0.4 10*3/uL (ref 0.1–1.0)
Monocytes Relative: 4 %
Neutro Abs: 7.9 10*3/uL — ABNORMAL HIGH (ref 1.7–7.7)
Neutrophils Relative %: 88 %
Platelets: 179 10*3/uL (ref 150–400)
RBC: 3.93 MIL/uL (ref 3.87–5.11)
RDW: 13 % (ref 11.5–15.5)
WBC: 9 10*3/uL (ref 4.0–10.5)
nRBC: 0 % (ref 0.0–0.2)

## 2023-08-17 LAB — ECHOCARDIOGRAM COMPLETE
AR max vel: 1.89 cm2
AV Area VTI: 2.02 cm2
AV Area mean vel: 1.95 cm2
AV Mean grad: 5 mm[Hg]
AV Peak grad: 9.4 mm[Hg]
Ao pk vel: 1.53 m/s
Area-P 1/2: 3.83 cm2
Calc EF: 43.8 %
Height: 67 in
MV M vel: 5.08 m/s
MV Peak grad: 103 mm[Hg]
S' Lateral: 3.9 cm
Single Plane A2C EF: 46.8 %
Single Plane A4C EF: 41.6 %
Weight: 2239.87 [oz_av]

## 2023-08-17 LAB — BASIC METABOLIC PANEL
Anion gap: 8 (ref 5–15)
BUN: 7 mg/dL (ref 6–20)
CO2: 23 mmol/L (ref 22–32)
Calcium: 8.2 mg/dL — ABNORMAL LOW (ref 8.9–10.3)
Chloride: 103 mmol/L (ref 98–111)
Creatinine, Ser: 0.57 mg/dL (ref 0.44–1.00)
GFR, Estimated: >60 mL/min (ref >60)
Glucose, Bld: 100 mg/dL — ABNORMAL HIGH (ref 70–99)
Potassium: 4.1 mmol/L (ref 3.5–5.1)
Sodium: 134 mmol/L — ABNORMAL LOW (ref 135–145)

## 2023-08-17 LAB — RAPID URINE DRUG SCREEN, HOSP PERFORMED
Amphetamines: NOT DETECTED
Barbiturates: NOT DETECTED
Benzodiazepines: NOT DETECTED
Cocaine: NOT DETECTED
Opiates: NOT DETECTED
Tetrahydrocannabinol: POSITIVE — AB

## 2023-08-17 LAB — MAGNESIUM: Magnesium: 2.3 mg/dL (ref 1.7–2.4)

## 2023-08-17 LAB — HIV ANTIBODY (ROUTINE TESTING W REFLEX): HIV Screen 4th Generation wRfx: NONREACTIVE

## 2023-08-17 MED ORDER — SODIUM CHLORIDE 0.9 % IV SOLN
12.5000 mg | Freq: Once | INTRAVENOUS | Status: AC
  Administered 2023-08-17: 12.5 mg via INTRAVENOUS
  Filled 2023-08-17 (×2): qty 0.5

## 2023-08-17 MED ORDER — HYDROMORPHONE HCL 1 MG/ML IJ SOLN
0.5000 mg | Freq: Once | INTRAMUSCULAR | Status: AC
  Administered 2023-08-17: 0.5 mg via INTRAVENOUS
  Filled 2023-08-17: qty 1

## 2023-08-17 MED ORDER — MORPHINE SULFATE (PF) 4 MG/ML IV SOLN
3.0000 mg | Freq: Once | INTRAVENOUS | Status: AC
  Administered 2023-08-17: 3 mg via INTRAVENOUS
  Filled 2023-08-17: qty 1

## 2023-08-17 MED ORDER — SODIUM CHLORIDE 0.9 % IV SOLN: INTRAVENOUS | Status: AC

## 2023-08-17 MED ORDER — PANTOPRAZOLE SODIUM 40 MG IV SOLR
40.0000 mg | Freq: Two times a day (BID) | INTRAVENOUS | Status: DC
  Administered 2023-08-17 (×2): 40 mg via INTRAVENOUS
  Filled 2023-08-17 (×2): qty 10

## 2023-08-17 MED ORDER — POTASSIUM CHLORIDE CRYS ER 20 MEQ PO TBCR
40.0000 meq | EXTENDED_RELEASE_TABLET | Freq: Once | ORAL | Status: AC
  Administered 2023-08-17: 40 meq via ORAL
  Filled 2023-08-17: qty 2

## 2023-08-17 MED ORDER — MAGNESIUM SULFATE 2 GM/50ML IV SOLN
2.0000 g | Freq: Once | INTRAVENOUS | Status: AC
  Administered 2023-08-17: 2 g via INTRAVENOUS
  Filled 2023-08-17: qty 50

## 2023-08-17 MED ORDER — LABETALOL HCL 5 MG/ML IV SOLN
10.0000 mg | INTRAVENOUS | Status: DC | PRN
  Administered 2023-08-17: 10 mg via INTRAVENOUS
  Filled 2023-08-17: qty 4

## 2023-08-17 NOTE — Progress Notes (Signed)
MEWS Progress Note  Patient Details Name: [name] MRN: [id number] DOB: 1986/05/29 Today's Date: 08/17/2023   MEWS Flowsheet Documentation:  Assess: MEWS Score Temp: 99.6 F (37.6 C) BP: (!) 119/90 MAP (mmHg): 98 Pulse Rate: 100 ECG Heart Rate: 99 Resp: 20 Level of Consciousness: Alert SpO2: 97 % O2 Device: Room Air Assess: MEWS Score MEWS Temp: 0 MEWS Systolic: 0 MEWS Pulse: 0 MEWS RR: 0 MEWS LOC: 0 MEWS Score: 0 MEWS Score Color: Green Assess: SIRS CRITERIA SIRS Temperature : 0 SIRS Respirations : 0 SIRS Pulse: 1 SIRS WBC: 0 SIRS Score Sum : 1 SIRS Temperature : 0 SIRS Pulse: 1 SIRS Respirations : 0 SIRS WBC: 0 SIRS Score Sum : 1 Assess: if the MEWS score is Yellow or Red Were vital signs accurate and taken at a resting state?: Yes Does the patient meet 2 or more of the SIRS criteria?: No MEWS guidelines implemented : Yes, yellow Treat MEWS Interventions: Considered administering scheduled or prn medications/treatments as ordered Take Vital Signs Increase Vital Sign Frequency : Yellow: Q2hr x1, continue Q4hrs until patient remains green for 12hrs Escalate MEWS: Escalate: Yellow: Discuss with charge nurse and consider notifying provider and/or RRT        Sanda Linger 08/17/2023, 6:51 PM

## 2023-08-17 NOTE — Progress Notes (Signed)
PROGRESS NOTE    [name]  HYQ:[id number] DOB: 10/24/1985 DOA: 08/16/2023 PCP: Patient, No Pcp Per   Brief Narrative:  Following detailed HPI and ED course directly copied from our dear colleague admitting hospitalist. HPI: [name] is a 37 y.o. female with medical history significant of ongoing EtOH and THC abuse.   Pt in to ED for 2nd time in 24h with recurrent N/V and abd pain, same as on 12/24.   Symptoms similar to prior episodes of either THC cyclic vomiting and/or EtOH gastritis.   In ED: some peaked T waves, K low at 2.6.  Concerning though is that her trop is 301.   LFTs and Lipase nl. Assessment & Plan:   Principal Problem:   Troponin level elevated Active Problems:   Alcohol withdrawal (HCC)   Nausea & vomiting  Intractable nausea vomiting: May be secondary to cannabinoid hyperemesis syndrome or more likely secondary to gastritis based on epigastric tenderness and history of 6-8 molls beer consumption daily, but could be sign of ACS.  Symptoms are improving, continue supportive care.  Has been placed n.p.o. since admission, not on any IV fluids.  Will start on IV fluids.  Also not on any PPI.  Will start on Protonix twice daily.  Also no UDS checked, will check that too.  Hypomagnesemia/hypokalemia: I do not see any magnesium replaced.  Will do that.  Also replace some potassium and will order morning labs today for BMP and magnesium.  Elevated troponin: Patient with no ACS symptoms but nausea vomiting could be due to ACS.  Troponin elevated around 300, flat.  Cardiology consulted.  EKG without any acute ST-T wave changes.  Echo pending.  Hypertensive urgency: Presented with blood pressure of 133/113.  Blood pressure improved and at times was slightly low normal but now rising again.  No previous history of hypertension.  Also intermittent tachycardia so will start on as needed labetalol for now.  Alcohol abuse/dependence: No signs of withdrawal.  Continue CIWA  with as needed Ativan and multivitamins.  DVT prophylaxis: enoxaparin (LOVENOX) injection 40 mg Start: 08/16/23 2300   Code Status: Full Code  Family Communication:  None present at bedside.  Plan of care discussed with patient in length and he/she verbalized understanding and agreed with it.  Status is: Observation The patient will require care spanning > 2 midnights and should be moved to inpatient because: Needs further workup by cardiology.   Estimated body mass index is 21.93 kg/m as calculated from the following:   Height as of this encounter: 5\' 7"  (1.702 m).   Weight as of this encounter: 63.5 kg.    Nutritional Assessment: Body mass index is 21.93 kg/m.Marland Kitchen Seen by dietician.  I agree with the assessment and plan as outlined below: Nutrition Status:        . Skin Assessment: I have examined the patient's skin and I agree with the wound assessment as performed by the wound care RN as outlined below:    Consultants:  Cardiology  Procedures:  None  Antimicrobials:  Anti-infectives (From admission, onward)    None         Subjective: Patient seen and examined.  She was sleepy because she received Ativan and Zofran an hour ago.  When woke up, she was complaining of some nausea and epigastric discomfort but denied any chest pain or shortness of breath or palpitation.  Objective: Vitals:   08/17/23 0549 08/17/23 0700 08/17/23 0743 08/17/23 0744  BP: (!) 125/95 (!) 146/100 Marland Kitchen)  137/106 (!) 137/106  Pulse: (!) 111 89 (!) 123 88  Resp: (!) 22 15 15    Temp:   98.6 F (37 C)   TempSrc:   Oral   SpO2: 98% 98% 96%   Weight:      Height:        Intake/Output Summary (Last 24 hours) at 08/17/2023 0747 Last data filed at 08/17/2023 0739 Gross per 24 hour  Intake 50 ml  Output --  Net 50 ml   Filed Weights   08/16/23 1544  Weight: 63.5 kg    Examination:  General exam: Appears calm and comfortable but sleepy Respiratory system: Clear to auscultation.  Respiratory effort normal. Cardiovascular system: S1 & S2 heard, RRR. No JVD, murmurs, rubs, gallops or clicks. No pedal edema. Gastrointestinal system: Abdomen is nondistended, soft and mild to moderate epigastric tenderness.  No organomegaly or masses felt. Normal bowel sounds heard. Central nervous system: Alert and oriented. No focal neurological deficits. Extremities: Symmetric 5 x 5 power. Skin: No rashes, lesions or ulcers  Data Reviewed: I have personally reviewed following labs and imaging studies  CBC: Recent Labs  Lab 08/15/23 0100 08/16/23 1629  WBC 14.4* 14.3*  NEUTROABS 13.1* 13.2*  HGB 13.6 15.4*  HCT 40.7 47.2*  MCV 101.2* 100.2*  PLT 222 276   Basic Metabolic Panel: Recent Labs  Lab 08/15/23 0100 08/16/23 1629 08/16/23 2140  NA 138 133*  --   K 2.9* 2.6*  --   CL 100 94*  --   CO2 22 26  --   GLUCOSE 179* 149*  --   BUN 9 10  --   CREATININE 0.60 0.61  --   CALCIUM 9.0 9.0  --   MG  --   --  1.6*   GFR: Estimated Creatinine Clearance: 93.6 mL/min (by C-G formula based on SCr of 0.61 mg/dL). Liver Function Tests: Recent Labs  Lab 08/15/23 0100 08/16/23 1629  AST 29 28  ALT 18 15  ALKPHOS 69 69  BILITOT 0.8 1.4*  PROT 8.2* 8.9*  ALBUMIN 4.5 4.7   Recent Labs  Lab 08/15/23 0100 08/16/23 1629  LIPASE 27 30   No results for input(s): "AMMONIA" in the last 168 hours. Coagulation Profile: No results for input(s): "INR", "PROTIME" in the last 168 hours. Cardiac Enzymes: No results for input(s): "CKTOTAL", "CKMB", "CKMBINDEX", "TROPONINI" in the last 168 hours. BNP (last 3 results) No results for input(s): "PROBNP" in the last 8760 hours. HbA1C: No results for input(s): "HGBA1C" in the last 72 hours. CBG: No results for input(s): "GLUCAP" in the last 168 hours. Lipid Profile: No results for input(s): "CHOL", "HDL", "LDLCALC", "TRIG", "CHOLHDL", "LDLDIRECT" in the last 72 hours. Thyroid Function Tests: No results for input(s): "TSH",  "T4TOTAL", "FREET4", "T3FREE", "THYROIDAB" in the last 72 hours. Anemia Panel: No results for input(s): "VITAMINB12", "FOLATE", "FERRITIN", "TIBC", "IRON", "RETICCTPCT" in the last 72 hours. Sepsis Labs: No results for input(s): "PROCALCITON", "LATICACIDVEN" in the last 168 hours.  No results found for this or any previous visit (from the past 240 hours).   Radiology Studies: No results found.  Scheduled Meds:  enoxaparin (LOVENOX) injection  40 mg Subcutaneous Q24H   folic acid  1 mg Oral Daily   multivitamin with minerals  1 tablet Oral Daily   thiamine  100 mg Oral Daily   Or   thiamine  100 mg Intravenous Daily   Continuous Infusions:   LOS: 0 days   Hughie Closs, MD [facility]  08/17/2023, 7:47 AM   *Please note that this is a verbal dictation therefore any spelling or grammatical errors are due to the "[employer]agon [employer] One" system interpretation.  Please page via Amion and do not message via secure chat for urgent patient care matters. Secure chat can be used for non urgent patient care matters.  How to contact the [facility] Attending or Consulting provider 7A - 7P or covering provider during after hours 7P -7A, for this patient?  Check the care team in [facility] and look for a) attending/consulting TR[facility] provider listed and b) the [facility] team listed. Page or secure chat 7A-7P. Log into [email or web address] and use [place]'s universal password to access. If you do not have the password, please contact the hospital operator. Locate the [facility] provider you are looking for under [facility] and page to a number that you can be directly reached. If you still have difficulty reaching the provider, please page the [facility] (Director on Call) for the Hospitalists listed on amion for assistance.

## 2023-08-17 NOTE — Progress Notes (Signed)
CSW added substance abuse resources to patient's AVS.  Burnham Trost, MSW, LCSW Transitions of Care  Clinical Social Worker II [phone]  

## 2023-08-17 NOTE — ED Notes (Signed)
[facility] called. 

## 2023-08-17 NOTE — Progress Notes (Signed)
Echocardiogram 2D Echocardiogram has been performed.  Reinaldo Raddle Norma Montemurro 08/17/2023, 9:59 AM

## 2023-08-17 NOTE — Discharge Instructions (Signed)

## 2023-08-17 NOTE — Plan of Care (Signed)
  Problem: Education: Goal: Understanding of cardiac disease, CV risk reduction, and recovery process will improve Outcome: Progressing   Problem: Activity: Goal: Ability to tolerate increased activity will improve Outcome: Progressing   Problem: Education: Goal: Knowledge of General Education information will improve Description: Including pain rating scale, medication(s)/side effects and non-pharmacologic comfort measures Outcome: Progressing   Problem: Health Behavior/Discharge Planning: Goal: Ability to manage health-related needs will improve Outcome: Progressing   Problem: Clinical Measurements: Goal: Will remain free from infection Outcome: Progressing Goal: Respiratory complications will improve Outcome: Progressing   Problem: Elimination: Goal: Will not experience complications related to bowel motility Outcome: Progressing Goal: Will not experience complications related to urinary retention Outcome: Progressing   Problem: Safety: Goal: Ability to remain free from injury will improve Outcome: Progressing   Problem: Skin Integrity: Goal: Risk for impaired skin integrity will decrease Outcome: Progressing

## 2023-08-17 NOTE — ED Notes (Signed)
Patient is resting comfortably. 

## 2023-08-17 NOTE — ED Notes (Signed)
Lab called to add on UDS.

## 2023-08-18 ENCOUNTER — Encounter (HOSPITAL_COMMUNITY): Payer: Self-pay | Admitting: Family Medicine

## 2023-08-18 DIAGNOSIS — R7989 Other specified abnormal findings of blood chemistry: Secondary | ICD-10-CM | POA: Diagnosis not present

## 2023-08-18 DIAGNOSIS — I429 Cardiomyopathy, unspecified: Secondary | ICD-10-CM | POA: Diagnosis not present

## 2023-08-18 DIAGNOSIS — E876 Hypokalemia: Secondary | ICD-10-CM

## 2023-08-18 DIAGNOSIS — I5021 Acute systolic (congestive) heart failure: Secondary | ICD-10-CM | POA: Diagnosis not present

## 2023-08-18 DIAGNOSIS — F10930 Alcohol use, unspecified with withdrawal, uncomplicated: Secondary | ICD-10-CM | POA: Diagnosis not present

## 2023-08-18 DIAGNOSIS — K2920 Alcoholic gastritis without bleeding: Secondary | ICD-10-CM | POA: Diagnosis not present

## 2023-08-18 DIAGNOSIS — E871 Hypo-osmolality and hyponatremia: Secondary | ICD-10-CM | POA: Insufficient documentation

## 2023-08-18 LAB — TROPONIN I (HIGH SENSITIVITY): Troponin I (High Sensitivity): 62 ng/L — ABNORMAL HIGH (ref <18)

## 2023-08-18 MED ORDER — SUCRALFATE 1 GM/10ML PO SUSP
1.0000 g | Freq: Three times a day (TID) | ORAL | Status: DC
  Administered 2023-08-18 – 2023-08-21 (×11): 1 g via ORAL
  Filled 2023-08-18 (×12): qty 10

## 2023-08-18 MED ORDER — ASPIRIN 81 MG PO CHEW
324.0000 mg | CHEWABLE_TABLET | Freq: Once | ORAL | Status: DC
  Filled 2023-08-18: qty 4

## 2023-08-18 MED ORDER — HYDROMORPHONE HCL 1 MG/ML IJ SOLN
0.5000 mg | Freq: Once | INTRAMUSCULAR | Status: AC
  Administered 2023-08-18: 0.5 mg via INTRAVENOUS
  Filled 2023-08-18: qty 1

## 2023-08-18 MED ORDER — FOLIC ACID 5 MG/ML IJ SOLN
1.0000 mg | Freq: Every day | INTRAMUSCULAR | Status: DC
  Administered 2023-08-18 – 2023-08-19 (×2): 1 mg via INTRAVENOUS
  Filled 2023-08-18 (×3): qty 0.2

## 2023-08-18 MED ORDER — METOPROLOL TARTRATE 12.5 MG HALF TABLET
12.5000 mg | ORAL_TABLET | Freq: Two times a day (BID) | ORAL | Status: DC
  Administered 2023-08-18 (×2): 12.5 mg via ORAL
  Filled 2023-08-18 (×2): qty 1

## 2023-08-18 MED ORDER — SUCRALFATE 1 GM/10ML PO SUSP
1.0000 g | Freq: Once | ORAL | Status: AC
  Administered 2023-08-18: 1 g via ORAL
  Filled 2023-08-18: qty 10

## 2023-08-18 MED ORDER — OXYCODONE HCL 5 MG PO TABS
5.0000 mg | ORAL_TABLET | Freq: Once | ORAL | Status: AC
  Administered 2023-08-18: 5 mg via ORAL
  Filled 2023-08-18: qty 1

## 2023-08-18 MED ORDER — ASPIRIN 81 MG PO TBEC
81.0000 mg | DELAYED_RELEASE_TABLET | Freq: Every day | ORAL | Status: DC
  Administered 2023-08-19 – 2023-08-21 (×3): 81 mg via ORAL
  Filled 2023-08-18 (×3): qty 1

## 2023-08-18 MED ORDER — PANTOPRAZOLE SODIUM 40 MG PO TBEC
40.0000 mg | DELAYED_RELEASE_TABLET | Freq: Two times a day (BID) | ORAL | Status: DC
  Administered 2023-08-18 – 2023-08-21 (×7): 40 mg via ORAL
  Filled 2023-08-18 (×7): qty 1

## 2023-08-18 MED ORDER — LACTATED RINGERS IV SOLN: INTRAVENOUS | Status: DC

## 2023-08-18 NOTE — Progress Notes (Signed)
  Progress Note   Patient: [name] NFA:[id number] DOB: 1986/02/08 DOA: 08/16/2023     1 DOS: the patient was seen and examined on 08/18/2023        Brief hospital course: 37 y.o. F with hx alcohol use who presented with few days nausea, vomiting and abdominal pain.  In the ER, K 2.6, Trop 301, and so admitted for further evaluation.     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  Follow-up echocardiogram shows EF 45 to 50%, regional wall motion abnormalities.  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 - Start metoprolol - Continue aspirin -Consult cardiology, appreciate recommendations, plan for angiography on Monday    Hyponatremia Mild, asymptomatic  Alcoholic gastritis THC use is fairly rare, alcohol use or heavy.  Suspect more likely alcoholic gastritis - Advance diet - IV fluids - Pantoprazole and sucralfate  Hypokalemia Supplemented and resolved  Alcohol withdrawal (HCC) Does not appear to be in alcohol withdrawal.  No history of complicated withdrawal - Continue thiamine and folate - Continue CIWA scoring          Subjective: Patient is feeling somewhat better, her abdominal pain and nausea is improved.  She has been eating ice.  She has had no chest pain, no dyspnea with exertion     Physical Exam: BP (!) 129/95 (BP Location: Right Arm)   Pulse (!) 106   Temp 98.3 F (36.8 C) (Oral)   Resp 16   Ht 5\' 7"  (1.702 m)   Wt 62.6 kg   LMP 07/22/2023 (Approximate)   SpO2 96%   BMI 21.62 kg/m   Adult female, sitting up in bed, interactive and appropriate Tachycardic, regular, no murmurs, no peripheral edema Respiratory rate normal, lungs clear without rales or wheezes Abdomen soft without tenderness palpation Affect normal, judgment and insight appear normal, face symmetric    Data  Reviewed: Discussed with cardiology Troponin down to 60 Basic metabolic panel normalized CBC shows chronic macrocytosis   Family Communication: None present    Disposition: Status is: Inpatient         Author: Alberteen Sam, MD 08/18/2023 4:36 PM  For on call review [email or web address].

## 2023-08-18 NOTE — Assessment & Plan Note (Signed)
-   Supplemented and resolved 

## 2023-08-18 NOTE — Assessment & Plan Note (Signed)
Mild, asymptomatic, with leukopenia. 

## 2023-08-18 NOTE — Consult Note (Signed)
Cardiology Consultation   Patient ID: [name] MRN: [id number]; DOB: [birth year]  Admit date: 08/16/2023 Date of Consult: 08/18/2023  PCP:  Patient, No Pcp Per   [facility] Providers Cardiologist:  New - Dr. Rennis Golden     Patient Profile:   [name] is a 37 y.o. female with a hx of marijuana and EtOH abuse who is being seen 08/18/2023 for the evaluation of abnormal echo and elevated trop at the request of Dr. Maryfrances Bunnell.  History of Present Illness:   Ms. [name] is a 37 year old female was past medical history of marijuana and EtOH abuse.  She has been struggling with EtOH abuse since her early 25s.  She has tried to wean herself off several times, however always resumed drinking heavily.  He never smoked.  She does not have any family history of CAD.  She herself has never seen a cardiologist in the past.  She never had any surgery in the past.  She does not know her father however her mother is healthy and living.  She previously had frequent ED visit for alcohol withdrawal or nausea vomiting associated with marijuana use and alcoholic gastritis.  She says she restarted drinking heavily a few days prior to Thanksgiving.  In the recent weeks, she has been noticing increased dyspnea on exertion.  She would feel fatigued and short of breath after walking short distance carry groceries.  She denies any chest pain.  She occasionally has rapid flutter sensation but only last for a few seconds before going away.  She never had any exertional chest pain.  Patient presented to [facility] on 08/16/2023 with nausea and vomiting likely associated with marijuana and alcohol use.  Last marijuana use was 3 to 4 days prior to admission.  On arrival, potassium was low at 2.6, sodium 133, likely associated with vomiting.  White blood cell count 14.3.  Serial troponin 301-->318.  Urinalysis showed hazy urine, small hemoglobin, greater than 300 proteins.  Urine drug test positive for  marijuana.  Alcohol level less than 10.  Initial EKG showed sinus tachycardia with nonspecific ST changes.  Echocardiogram obtained on 08/17/2023 demonstrated EF 45 to 50%, akinesis of the basal inferior and inferoseptal wall, mild MR.  No previous echocardiogram to compare.  Cardiology service consulted for elevated troponin and abnormal echocardiogram.   Past Medical History:  Diagnosis Date   Bartholin's cyst     Past Surgical History:  Procedure Laterality Date   No past surgical history       Home Medications:  Prior to Admission medications   Medication Sig Start Date End Date Taking? Authorizing Provider  naproxen (NAPROSYN) 500 MG tablet Take 500 mg by mouth 2 (two) times daily as needed for moderate pain (pain score 4-6) or mild pain (pain score 1-3). 01/06/22  Yes [provider]  famotidine (PEPCID) 40 MG tablet Take 1 tablet (40 mg total) by mouth daily. 11/21/22   Lonell Grandchild, MD  levonorgestrel (MIRENA) 20 MCG/24HR IUD 1 each by Intrauterine route once.    [provider]  levonorgestrel (MIRENA) 20 MCG/DAY IUD 1 each by Intrauterine route once.    [provider]  ondansetron (ZOFRAN) 4 MG tablet Take 1 tablet (4 mg total) by mouth every 6 (six) hours. 11/21/22   Lonell Grandchild, MD  ondansetron (ZOFRAN-ODT) 4 MG disintegrating tablet Take 1 tablet (4 mg total) by mouth every 8 (eight) hours as needed for up to 3 days for nausea or vomiting.  08/15/23 08/18/23  Nira Conn, MD  pantoprazole (PROTONIX) 20 MG tablet Take 1 tablet (20 mg total) by mouth daily. 08/15/23   Cardama, Amadeo Garnet, MD  sucralfate (CARAFATE) 1 g tablet Take 1 tablet (1 g total) by mouth 4 (four) times daily -  with meals and at bedtime. 11/24/22   Gilda Crease, MD    Inpatient Medications: Scheduled Meds:  aspirin  324 mg Oral Once   [START ON 08/19/2023] aspirin EC  81 mg Oral Daily   enoxaparin (LOVENOX) injection  40 mg Subcutaneous Q24H    folic acid  1 mg Oral Daily   multivitamin with minerals  1 tablet Oral Daily   pantoprazole  40 mg Oral BID   sucralfate  1 g Oral Once   thiamine  100 mg Oral Daily   Or   thiamine  100 mg Intravenous Daily   Continuous Infusions:  lactated ringers     PRN Meds: acetaminophen, labetalol, LORazepam **OR** LORazepam, ondansetron (ZOFRAN) IV  Allergies:    Allergies  Allergen Reactions   Morphine And Codeine Swelling    Per pt 08/17/23 has arm swelling after IV dose, happened > 5 yrs ok and resolved quickly, she's willing to try again    Social History:   Social History   Socioeconomic History   Marital status: Single    Spouse name: Not on file   Number of children: Not on file   Years of education: Not on file   Highest education level: Not on file  Occupational History   Not on file  Tobacco Use   Smoking status: Never   Smokeless tobacco: Never  Vaping Use   Vaping status: Former  Substance and Sexual Activity   Alcohol use: Yes    Alcohol/week: 14.0 standard drinks of alcohol    Types: 2 Glasses of wine, 12 Cans of beer per week   Drug use: Yes    Types: Marijuana   Sexual activity: Not Currently  Other Topics Concern   Not on file  Social History Narrative   Not on file   Social Drivers of Health   Financial Resource Strain: Not on file  Food Insecurity: No Food Insecurity (08/17/2023)   Hunger Vital Sign    Worried About Running Out of Food in the Last Year: Never true    Ran Out of Food in the Last Year: Never true  Transportation Needs: Unmet Transportation Needs (08/17/2023)   PRAPARE - Administrator, Civil Service (Medical): Yes    Lack of Transportation (Non-Medical): Yes  Physical Activity: Not on file  Stress: Not on file  Social Connections: Not on file  Intimate Partner Violence: Not At Risk (08/18/2023)   Humiliation, Afraid, Rape, and Kick questionnaire    Fear of Current or Ex-Partner: No    Emotionally Abused: No     Physically Abused: No    Sexually Abused: No    Family History:    Family History  Problem Relation Age of Onset   Heart attack Neg Hx      ROS:  Please see the history of present illness.   All other ROS reviewed and negative.     Physical Exam/Data:   Vitals:   08/18/23 0356 08/18/23 0502 08/18/23 0625 08/18/23 0631  BP: 112/76   (!) 129/95  Pulse: 83 93 (!) 101 100  Resp: 16     Temp:      TempSrc:      SpO2: 97%  99% 96% 96%  Weight:      Height:        Intake/Output Summary (Last 24 hours) at 08/18/2023 0930 Last data filed at 08/18/2023 0981 Gross per 24 hour  Intake 2075.28 ml  Output 450 ml  Net 1625.28 ml      08/17/2023    4:14 PM 08/16/2023    3:44 PM 08/14/2023   11:34 PM  Last 3 Weights  Weight (lbs) 138 lb 0.1 oz 139 lb 15.9 oz 140 lb  Weight (kg) 62.6 kg 63.5 kg 63.504 kg     Body mass index is 21.62 kg/m.  General:  Well nourished, well developed, in no acute distress HEENT: normal Neck: no JVD Vascular: No carotid bruits; Distal pulses 2+ bilaterally Cardiac:  normal S1, S2; RRR; no murmur  Lungs:  clear to auscultation bilaterally, no wheezing, rhonchi or rales  Abd: soft, nontender, no hepatomegaly  Ext: no edema Musculoskeletal:  No deformities, BUE and BLE strength normal and equal Skin: warm and dry  Neuro:  CNs 2-12 intact, no focal abnormalities noted Psych:  Normal affect   EKG:  The EKG was personally reviewed and demonstrates: Sinus tachycardia, nonspecific ST changes. Telemetry:  Telemetry was personally reviewed and demonstrates: Sinus rhythm, heart rate mildly tachycardic around 100 bpm.  Relevant CV Studies:  Echo 08/17/2023  1. Akinesis of the basal inferior and inferoseptal walls; overall mild LV  dysfunction.   2. Left ventricular ejection fraction, by estimation, is 45 to 50%. The  left ventricle has mildly decreased function. The left ventricle  demonstrates regional wall motion abnormalities (see scoring   diagram/findings for description). Left ventricular  diastolic parameters were normal.   3. Right ventricular systolic function is normal. The right ventricular  size is normal.   4. The mitral valve is normal in structure. Mild mitral valve  regurgitation. No evidence of mitral stenosis.   5. The aortic valve is tricuspid. Aortic valve regurgitation is not  visualized. No aortic stenosis is present.   6. The inferior vena cava is normal in size with greater than 50%  respiratory variability, suggesting right atrial pressure of 3 mmHg.   Comparison(s): No prior Echocardiogram.   Laboratory Data:  High Sensitivity Troponin:   Recent Labs  Lab 08/16/23 1629 08/16/23 2140  TROPONINIHS 301* 318*     Chemistry Recent Labs  Lab 08/15/23 0100 08/16/23 1629 08/16/23 2140 08/17/23 0901  NA 138 133*  --  134*  K 2.9* 2.6*  --  4.1  CL 100 94*  --  103  CO2 22 26  --  23  GLUCOSE 179* 149*  --  100*  BUN 9 10  --  7  CREATININE 0.60 0.61  --  0.57  CALCIUM 9.0 9.0  --  8.2*  MG  --   --  1.6* 2.3  GFRNONAA >60 >60  --  >60  ANIONGAP 16* 13  --  8    Recent Labs  Lab 08/15/23 0100 08/16/23 1629  PROT 8.2* 8.9*  ALBUMIN 4.5 4.7  AST 29 28  ALT 18 15  ALKPHOS 69 69  BILITOT 0.8 1.4*   Lipids No results for input(s): "CHOL", "TRIG", "HDL", "LABVLDL", "LDLCALC", "CHOLHDL" in the last 168 hours.  Hematology Recent Labs  Lab 08/15/23 0100 08/16/23 1629 08/17/23 0901  WBC 14.4* 14.3* 9.0  RBC 4.02 4.71 3.93  HGB 13.6 15.4* 13.2  HCT 40.7 47.2* 39.9  MCV 101.2* 100.2* 101.5*  MCH 33.8 32.7 33.6  MCHC 33.4 32.6 33.1  RDW 13.0 12.9 13.0  PLT 222 276 179   Thyroid No results for input(s): "TSH", "FREET4" in the last 168 hours.  BNPNo results for input(s): "BNP", "PROBNP" in the last 168 hours.  DDimer No results for input(s): "DDIMER" in the last 168 hours.   Radiology/Studies:  ECHOCARDIOGRAM COMPLETE Result Date: 08/17/2023    ECHOCARDIOGRAM REPORT   Patient  Name:   Rashana Fullenwider Date of Exam: 08/17/2023 Medical Rec #:  [id number]     Height:       67.0 in Accession #:    [id number]    Weight:       140.0 lb Date of Birth:  [birth year]     BSA:          1.738 m Patient Age:    37 years      BP:           105/79 mmHg Patient Gender: F             HR:           85 bpm. Exam Location:  Inpatient Procedure: 2D Echo, Cardiac Doppler and Color Doppler Indications:    Elevated Troponin  History:        Patient has prior history of Echocardiogram examinations, most                 recent 03/04/2009.  Sonographer:    Karma Ganja Referring Phys: [phone] JARED M GARDNER IMPRESSIONS  1. Akinesis of the basal inferior and inferoseptal walls; overall mild LV dysfunction.  2. Left ventricular ejection fraction, by estimation, is 45 to 50%. The left ventricle has mildly decreased function. The left ventricle demonstrates regional wall motion abnormalities (see scoring diagram/findings for description). Left ventricular diastolic parameters were normal.  3. Right ventricular systolic function is normal. The right ventricular size is normal.  4. The mitral valve is normal in structure. Mild mitral valve regurgitation. No evidence of mitral stenosis.  5. The aortic valve is tricuspid. Aortic valve regurgitation is not visualized. No aortic stenosis is present.  6. The inferior vena cava is normal in size with greater than 50% respiratory variability, suggesting right atrial pressure of 3 mmHg. Comparison(s): No prior Echocardiogram. FINDINGS  Left Ventricle: Left ventricular ejection fraction, by estimation, is 45 to 50%. The left ventricle has mildly decreased function. The left ventricle demonstrates regional wall motion abnormalities. The left ventricular internal cavity size was normal in size. There is no left ventricular hypertrophy. Left ventricular diastolic parameters were normal. Right Ventricle: The right ventricular size is normal. Right ventricular systolic function is normal. Left  Atrium: Left atrial size was normal in size. Right Atrium: Right atrial size was normal in size. Pericardium: There is no evidence of pericardial effusion. Mitral Valve: The mitral valve is normal in structure. Mild mitral valve regurgitation. No evidence of mitral valve stenosis. Tricuspid Valve: The tricuspid valve is normal in structure. Tricuspid valve regurgitation is trivial. No evidence of tricuspid stenosis. Aortic Valve: The aortic valve is tricuspid. Aortic valve regurgitation is not visualized. No aortic stenosis is present. Aortic valve mean gradient measures 5.0 mmHg. Aortic valve peak gradient measures 9.4 mmHg. Aortic valve area, by VTI measures 2.02 cm. Pulmonic Valve: The pulmonic valve was normal in structure. Pulmonic valve regurgitation is trivial. No evidence of pulmonic stenosis. Aorta: The aortic root is normal in size and structure. Venous: The inferior vena cava is normal in size with greater than 50% respiratory variability, suggesting  right atrial pressure of 3 mmHg. IAS/Shunts: No atrial level shunt detected by color flow Doppler. Additional Comments: Akinesis of the basal inferior and inferoseptal walls; overall mild LV dysfunction.  LEFT VENTRICLE PLAX 2D LVIDd:         5.00 cm     Diastology LVIDs:         3.90 cm     LV e' medial:    8.38 cm/s LV PW:         1.00 cm     LV E/e' medial:  10.3 LV IVS:        0.90 cm     LV e' lateral:   11.40 cm/s LVOT diam:     1.90 cm     LV E/e' lateral: 7.6 LV SV:         57 LV SV Index:   33 LVOT Area:     2.84 cm  LV Volumes (MOD) LV vol d, MOD A2C: 90.6 ml LV vol d, MOD A4C: 98.9 ml LV vol s, MOD A2C: 48.2 ml LV vol s, MOD A4C: 57.8 ml LV SV MOD A2C:     42.4 ml LV SV MOD A4C:     98.9 ml LV SV MOD BP:      42.3 ml RIGHT VENTRICLE             IVC RV Basal diam:  3.50 cm     IVC diam: 1.70 cm RV S prime:     15.90 cm/s TAPSE (M-mode): 2.5 cm LEFT ATRIUM           Index        RIGHT ATRIUM           Index LA diam:      3.00 cm 1.73 cm/m   RA  Area:     11.80 cm LA Vol (A2C): 56.5 ml 32.51 ml/m  RA Volume:   26.50 ml  15.25 ml/m LA Vol (A4C): 33.9 ml 19.51 ml/m  AORTIC VALVE AV Area (Vmax):    1.89 cm AV Area (Vmean):   1.95 cm AV Area (VTI):     2.02 cm AV Vmax:           153.00 cm/s AV Vmean:          106.000 cm/s AV VTI:            0.281 m AV Peak Grad:      9.4 mmHg AV Mean Grad:      5.0 mmHg LVOT Vmax:         102.00 cm/s LVOT Vmean:        72.900 cm/s LVOT VTI:          0.200 m LVOT/AV VTI ratio: 0.71  AORTA Ao Root diam: 2.70 cm Ao Asc diam:  2.60 cm MITRAL VALVE MV Area (PHT): 3.83 cm    SHUNTS MV Decel Time: 198 msec    Systemic VTI:  0.20 m MR Peak grad: 103.0 mmHg   Systemic Diam: 1.90 cm MR Mean grad: 66.0 mmHg MR Vmax:      507.50 cm/s MR Vmean:     384.0 cm/s MV E velocity: 86.10 cm/s MV A velocity: 65.60 cm/s MV E/A ratio:  1.31 Olga Millers MD Electronically signed by Olga Millers MD Signature Date/Time: 08/17/2023/12:15:40 PM    Final      Assessment and Plan:   Abnormal echocardiogram  -Echocardiogram obtained on 08/17/2023 showed EF 45 to 50% with EF basal inferior and  inferoseptal wall akinesis.  Serial troponin around 300 however flat on repeat.  Patient denies any recent exertional chest pain, she has occasional fluttering sensation but only last a few seconds.  She has been noticing new onset of worsening shortness of breath with exertion however no lower extremity edema.  Appears to be euvolemic on physical exam.  -Will discuss with MD, potentially consider coronary CT versus cardiac catheterization given abnormal echocardiogram.  Problem with coronary CT is her heart rate is persistently elevated around 100 bpm.  Electrolyte derangement: Related to nausea and vomiting.  Potassium 2.6 on arrival, this was repleted.  Potassium 4.1 this morning.  Magnesium 1.6 on arrival, improved to 2.3 this morning.  EtOH abuse: Patient has been using alcohol heavily since early 20s.  She has frequent hospitalization for  alcohol withdrawal, nausea and vomiting associated with alcohol use and marijuana use.  Strongly urged her to stop drinking, patient seems to be determined to quit both alcohol and marijuana.  Marijuana use   Risk Assessment/Risk Scores:     TIMI Risk Score for Unstable Angina or Non-ST Elevation MI:   The patient's TIMI risk score is 1, which indicates a 5% risk of all cause mortality, new or recurrent myocardial infarction or need for urgent revascularization in the next 14 days.          For questions or updates, please contact [facility] Please consult [email or web address] for contact info under    Signed, Azalee Course, PA  08/18/2023 9:30 AM

## 2023-08-18 NOTE — Plan of Care (Signed)
  Problem: Activity: Goal: Ability to tolerate increased activity will improve Outcome: Progressing   Problem: Cardiac: Goal: Ability to achieve and maintain adequate cardiovascular perfusion will improve Outcome: Progressing   Problem: Clinical Measurements: Goal: Respiratory complications will improve Outcome: Progressing Goal: Cardiovascular complication will be avoided Outcome: Progressing   Problem: Pain Management: Goal: General experience of comfort will improve Outcome: Progressing   Problem: Safety: Goal: Ability to remain free from injury will improve Outcome: Progressing

## 2023-08-18 NOTE — Hospital Course (Signed)
37 y.o. F with hx alcohol use who presented with few days nausea, vomiting and abdominal pain.  In the ER, K 2.6, Trop 301, and so admitted for further evaluation.

## 2023-08-19 DIAGNOSIS — E876 Hypokalemia: Secondary | ICD-10-CM | POA: Diagnosis not present

## 2023-08-19 DIAGNOSIS — I10 Essential (primary) hypertension: Secondary | ICD-10-CM | POA: Diagnosis not present

## 2023-08-19 DIAGNOSIS — F10930 Alcohol use, unspecified with withdrawal, uncomplicated: Secondary | ICD-10-CM | POA: Diagnosis not present

## 2023-08-19 DIAGNOSIS — K2920 Alcoholic gastritis without bleeding: Secondary | ICD-10-CM | POA: Diagnosis not present

## 2023-08-19 DIAGNOSIS — I429 Cardiomyopathy, unspecified: Secondary | ICD-10-CM | POA: Diagnosis not present

## 2023-08-19 DIAGNOSIS — F10939 Alcohol use, unspecified with withdrawal, unspecified: Secondary | ICD-10-CM | POA: Diagnosis not present

## 2023-08-19 LAB — CBC
HCT: 37.3 % (ref 36.0–46.0)
Hemoglobin: 12.7 g/dL (ref 12.0–15.0)
MCH: 33.4 pg (ref 26.0–34.0)
MCHC: 34 g/dL (ref 30.0–36.0)
MCV: 98.2 fL (ref 80.0–100.0)
Platelets: 148 10*3/uL — ABNORMAL LOW (ref 150–400)
RBC: 3.8 MIL/uL — ABNORMAL LOW (ref 3.87–5.11)
RDW: 12.5 % (ref 11.5–15.5)
WBC: 5.3 10*3/uL (ref 4.0–10.5)
nRBC: 0 % (ref 0.0–0.2)

## 2023-08-19 LAB — COMPREHENSIVE METABOLIC PANEL
ALT: 10 U/L (ref 0–44)
AST: 14 U/L — ABNORMAL LOW (ref 15–41)
Albumin: 2.9 g/dL — ABNORMAL LOW (ref 3.5–5.0)
Alkaline Phosphatase: 41 U/L (ref 38–126)
Anion gap: 9 (ref 5–15)
BUN: <5 mg/dL — ABNORMAL LOW (ref 6–20)
CO2: 26 mmol/L (ref 22–32)
Calcium: 8.4 mg/dL — ABNORMAL LOW (ref 8.9–10.3)
Chloride: 100 mmol/L (ref 98–111)
Creatinine, Ser: 0.6 mg/dL (ref 0.44–1.00)
GFR, Estimated: >60 mL/min (ref >60)
Glucose, Bld: 89 mg/dL (ref 70–99)
Potassium: 3 mmol/L — ABNORMAL LOW (ref 3.5–5.1)
Sodium: 135 mmol/L (ref 135–145)
Total Bilirubin: 1.1 mg/dL (ref <1.2)
Total Protein: 5.5 g/dL — ABNORMAL LOW (ref 6.5–8.1)

## 2023-08-19 MED ORDER — LACTATED RINGERS IV SOLN
INTRAVENOUS | Status: DC
Start: 2023-08-19 — End: 2023-08-19

## 2023-08-19 MED ORDER — SODIUM CHLORIDE 0.9 % IV SOLN
12.5000 mg | Freq: Four times a day (QID) | INTRAVENOUS | Status: DC | PRN
  Administered 2023-08-19: 12.5 mg via INTRAVENOUS
  Filled 2023-08-19: qty 0.5
  Filled 2023-08-19: qty 12.5

## 2023-08-19 MED ORDER — POTASSIUM CL IN DEXTROSE 5% 20 MEQ/L IV SOLN
20.0000 meq | INTRAVENOUS | Status: AC
Start: 2023-08-19 — End: 2023-08-20
  Administered 2023-08-19 – 2023-08-20 (×3): 20 meq via INTRAVENOUS
  Filled 2023-08-19 (×4): qty 1000

## 2023-08-19 MED ORDER — POTASSIUM CHLORIDE 10 MEQ/100ML IV SOLN
10.0000 meq | INTRAVENOUS | Status: AC
  Administered 2023-08-19 (×4): 10 meq via INTRAVENOUS
  Filled 2023-08-19 (×4): qty 100

## 2023-08-19 MED ORDER — HYDROMORPHONE HCL 1 MG/ML IJ SOLN: 0.5000 mg | INTRAMUSCULAR | Status: DC | PRN

## 2023-08-19 MED ORDER — HYDROMORPHONE HCL 1 MG/ML IJ SOLN
0.5000 mg | Freq: Once | INTRAMUSCULAR | Status: AC
  Administered 2023-08-19: 0.5 mg via INTRAVENOUS
  Filled 2023-08-19: qty 1

## 2023-08-19 MED ORDER — METOPROLOL TARTRATE 25 MG PO TABS
25.0000 mg | ORAL_TABLET | Freq: Two times a day (BID) | ORAL | Status: DC
  Administered 2023-08-19 – 2023-08-20 (×4): 25 mg via ORAL
  Filled 2023-08-19 (×4): qty 1

## 2023-08-19 MED ORDER — LOSARTAN POTASSIUM 25 MG PO TABS
25.0000 mg | ORAL_TABLET | Freq: Every day | ORAL | Status: DC
Start: 2023-08-19 — End: 2023-08-21
  Administered 2023-08-19 – 2023-08-21 (×2): 25 mg via ORAL
  Filled 2023-08-19 (×2): qty 1

## 2023-08-19 MED ORDER — BOOST / RESOURCE BREEZE PO LIQD CUSTOM
1.0000 | Freq: Three times a day (TID) | ORAL | Status: DC
  Administered 2023-08-19 – 2023-08-20 (×2): 1 via ORAL

## 2023-08-19 NOTE — Progress Notes (Signed)
DAILY PROGRESS NOTE   Patient Name: [name] Date of Encounter: 08/19/2023 Cardiologist: None  Chief Complaint   No chest pain  Patient Profile   [name] is a 37 y.o. female with a hx of marijuana and EtOH abuse who is being seen 08/18/2023 for the evaluation of abnormal echo and elevated trop at the request of Dr. Maryfrances Bunnell.   Subjective   Symptoms improved overnight- HR now in the 80-90's. BP remains elevated - persistent hypokalemia, 3.0 today. Tolerating low dose BB.  Objective   Vitals:   08/19/23 0509 08/19/23 0550 08/19/23 0624 08/19/23 0850  BP: (!) 139/98 (!) 157/120 (!) 149/102 (!) 143/110  Pulse: 89  83 88  Resp: 20  18 16   Temp:    98.9 F (37.2 C)  TempSrc:    Oral  SpO2:    98%  Weight:      Height:        Intake/Output Summary (Last 24 hours) at 08/19/2023 0454 Last data filed at 08/19/2023 0981 Gross per 24 hour  Intake 2442.64 ml  Output 800 ml  Net 1642.64 ml   Filed Weights   08/16/23 1544 08/17/23 1614  Weight: 63.5 kg 62.6 kg    Physical Exam   General appearance: alert and no distress Neck: no carotid bruit, no JVD, and thyroid not enlarged, symmetric, no tenderness/mass/nodules Lungs: clear to auscultation bilaterally Heart: regular rate and rhythm, S1, S2 normal, no murmur, click, rub or gallop Abdomen: soft, non-tender; bowel sounds normal; no masses,  no organomegaly Extremities: extremities normal, atraumatic, no cyanosis or edema Pulses: 2+ and symmetric Skin: Skin color, texture, turgor normal. No rashes or lesions Neurologic: Grossly normal Psych: Plesasant  Inpatient Medications    Scheduled Meds:  aspirin  324 mg Oral Once   aspirin EC  81 mg Oral Daily   enoxaparin (LOVENOX) injection  40 mg Subcutaneous Q24H   folic acid  1 mg Intravenous Daily   metoprolol tartrate  12.5 mg Oral BID   multivitamin with minerals  1 tablet Oral Daily   pantoprazole  40 mg Oral BID   sucralfate  1 g Oral TID WC & HS    thiamine  100 mg Oral Daily   Or   thiamine  100 mg Intravenous Daily    Continuous Infusions:  lactated ringers 50 mL/hr at 08/19/23 1914   potassium chloride 10 mEq (08/19/23 0912)    PRN Meds: acetaminophen, LORazepam **OR** LORazepam, ondansetron (ZOFRAN) IV   Labs   Results for orders placed or performed during the hospital encounter of 08/16/23 (from the past 48 hours)  Troponin I (High Sensitivity)     Status: Abnormal   Collection Time: 08/18/23  8:53 AM  Result Value Ref Range   Troponin I (High Sensitivity) 62 (H) <18 ng/L    Comment: DELTA CHECK NOTED (NOTE) Elevated high sensitivity troponin I (hsTnI) values and significant  changes across serial measurements may suggest ACS but many other  chronic and acute conditions are known to elevate hsTnI results.  Refer to the "Links" section for chest pain algorithms and additional  guidance. Performed at [facility], [address] [place], [place] [address]   CBC     Status: Abnormal   Collection Time: 08/19/23  3:10 AM  Result Value Ref Range   WBC 5.3 4.0 - 10.5 K/uL   RBC 3.80 (L) 3.87 - 5.11 MIL/uL   Hemoglobin 12.7 12.0 - 15.0 g/dL   HCT 62.1 30.8 -  46.0 %   MCV 98.2 80.0 - 100.0 fL   MCH 33.4 26.0 - 34.0 pg   MCHC 34.0 30.0 - 36.0 g/dL   RDW 45.4 09.8 - 11.9 %   Platelets 148 (L) 150 - 400 K/uL   nRBC 0.0 0.0 - 0.2 %    Comment: Performed at [facility], [address], [place], [place] [address]  Comprehensive metabolic panel     Status: Abnormal   Collection Time: 08/19/23  3:10 AM  Result Value Ref Range   Sodium 135 135 - 145 mmol/L   Potassium 3.0 (L) 3.5 - 5.1 mmol/L   Chloride 100 98 - 111 mmol/L   CO2 26 22 - 32 mmol/L   Glucose, Bld 89 70 - 99 mg/dL    Comment: Glucose reference range applies only to samples taken after fasting for at least 8 hours.   BUN <5 (L) 6 - 20 mg/dL   Creatinine, Ser 9.56 0.44 - 1.00 mg/dL   Calcium 8.4 (L) 8.9 - 10.3 mg/dL   Total Protein 5.5 (L)  6.5 - 8.1 g/dL   Albumin 2.9 (L) 3.5 - 5.0 g/dL   AST 14 (L) 15 - 41 U/L   ALT 10 0 - 44 U/L   Alkaline Phosphatase 41 38 - 126 U/L   Total Bilirubin 1.1 <1.2 mg/dL   GFR, Estimated >21 >30 mL/min    Comment: (NOTE) Calculated using the CKD-EPI Creatinine Equation (2021)    Anion gap 9 5 - 15    Comment: Performed at [facility], [address], [place], [place] [address]    ECG   N/A  Telemetry   Sinus rhythm - Personally Reviewed  Radiology    ECHOCARDIOGRAM COMPLETE Result Date: 08/17/2023    ECHOCARDIOGRAM REPORT   Patient Name:   [name] Date of Exam: 08/17/2023 Medical Rec #:  [id number]     Height:       67.0 in Accession #:    [id number]    Weight:       140.0 lb Date of Birth:  06-09-1986     BSA:          1.738 m Patient Age:    37 years      BP:           105/79 mmHg Patient Gender: F             HR:           85 bpm. Exam Location:  Inpatient Procedure: 2D Echo, Cardiac Doppler and Color Doppler Indications:    Elevated Troponin  History:        Patient has prior history of Echocardiogram examinations, most                 recent 03/04/2009.  Sonographer:    Karma Ganja Referring Phys: [phone] JARED M GARDNER IMPRESSIONS  1. Akinesis of the basal inferior and inferoseptal walls; overall mild LV dysfunction.  2. Left ventricular ejection fraction, by estimation, is 45 to 50%. The left ventricle has mildly decreased function. The left ventricle demonstrates regional wall motion abnormalities (see scoring diagram/findings for description). Left ventricular diastolic parameters were normal.  3. Right ventricular systolic function is normal. The right ventricular size is normal.  4. The mitral valve is normal in structure. Mild mitral valve regurgitation. No evidence of mitral stenosis.  5. The aortic valve is tricuspid. Aortic valve regurgitation is not visualized. No aortic stenosis is present.  6. The inferior vena cava is normal in size with greater than 50% respiratory  variability, suggesting right atrial pressure of 3 mmHg. Comparison(s): No prior Echocardiogram. FINDINGS  Left Ventricle: Left ventricular ejection fraction, by estimation, is 45 to 50%. The left ventricle has mildly decreased function. The left ventricle demonstrates regional wall motion abnormalities. The left ventricular internal cavity size was normal in size. There is no left ventricular hypertrophy. Left ventricular diastolic parameters were normal. Right Ventricle: The right ventricular size is normal. Right ventricular systolic function is normal. Left Atrium: Left atrial size was normal in size. Right Atrium: Right atrial size was normal in size. Pericardium: There is no evidence of pericardial effusion. Mitral Valve: The mitral valve is normal in structure. Mild mitral valve regurgitation. No evidence of mitral valve stenosis. Tricuspid Valve: The tricuspid valve is normal in structure. Tricuspid valve regurgitation is trivial. No evidence of tricuspid stenosis. Aortic Valve: The aortic valve is tricuspid. Aortic valve regurgitation is not visualized. No aortic stenosis is present. Aortic valve mean gradient measures 5.0 mmHg. Aortic valve peak gradient measures 9.4 mmHg. Aortic valve area, by VTI measures 2.02 cm. Pulmonic Valve: The pulmonic valve was normal in structure. Pulmonic valve regurgitation is trivial. No evidence of pulmonic stenosis. Aorta: The aortic root is normal in size and structure. Venous: The inferior vena cava is normal in size with greater than 50% respiratory variability, suggesting right atrial pressure of 3 mmHg. IAS/Shunts: No atrial level shunt detected by color flow Doppler. Additional Comments: Akinesis of the basal inferior and inferoseptal walls; overall mild LV dysfunction.  LEFT VENTRICLE PLAX 2D LVIDd:         5.00 cm     Diastology LVIDs:         3.90 cm     LV e' medial:    8.38 cm/s LV PW:         1.00 cm     LV E/e' medial:  10.3 LV IVS:        0.90 cm     LV e'  lateral:   11.40 cm/s LVOT diam:     1.90 cm     LV E/e' lateral: 7.6 LV SV:         57 LV SV Index:   33 LVOT Area:     2.84 cm  LV Volumes (MOD) LV vol d, MOD A2C: 90.6 ml LV vol d, MOD A4C: 98.9 ml LV vol s, MOD A2C: 48.2 ml LV vol s, MOD A4C: 57.8 ml LV SV MOD A2C:     42.4 ml LV SV MOD A4C:     98.9 ml LV SV MOD BP:      42.3 ml RIGHT VENTRICLE             IVC RV Basal diam:  3.50 cm     IVC diam: 1.70 cm RV S prime:     15.90 cm/s TAPSE (M-mode): 2.5 cm LEFT ATRIUM           Index        RIGHT ATRIUM           Index LA diam:      3.00 cm 1.73 cm/m   RA Area:     11.80 cm LA Vol (A2C): 56.5 ml 32.51 ml/m  RA Volume:   26.50 ml  15.25 ml/m LA Vol (A4C): 33.9 ml 19.51 ml/m  AORTIC VALVE AV Area (Vmax):    1.89 cm AV Area (Vmean):   1.95  cm AV Area (VTI):     2.02 cm AV Vmax:           153.00 cm/s AV Vmean:          106.000 cm/s AV VTI:            0.281 m AV Peak Grad:      9.4 mmHg AV Mean Grad:      5.0 mmHg LVOT Vmax:         102.00 cm/s LVOT Vmean:        72.900 cm/s LVOT VTI:          0.200 m LVOT/AV VTI ratio: 0.71  AORTA Ao Root diam: 2.70 cm Ao Asc diam:  2.60 cm MITRAL VALVE MV Area (PHT): 3.83 cm    SHUNTS MV Decel Time: 198 msec    Systemic VTI:  0.20 m MR Peak grad: 103.0 mmHg   Systemic Diam: 1.90 cm MR Mean grad: 66.0 mmHg MR Vmax:      507.50 cm/s MR Vmean:     384.0 cm/s MV E velocity: 86.10 cm/s MV A velocity: 65.60 cm/s MV E/A ratio:  1.31 Olga Millers MD Electronically signed by Olga Millers MD Signature Date/Time: 08/17/2023/12:15:40 PM    Final     Cardiac Studies   See echo above  Assessment   Principal Problem:   Cardiomyopathy [facility]) Active Problems:   Alcohol withdrawal (HCC)   Hypokalemia   Alcoholic gastritis   Hyponatremia   Plan   Etoh withdrawal symptoms improving. HR better - can increase metoprolol to 25 mg BID. Add losartan 25 mg daily for persistent hypotension. Hypokalemic - replete. Consider aldactone, however, hesitant due to etoh abuse. Will  need coronary evaluation - if HR better controlled, consider CCTA tomorrow since suspicion for CAD is low.  Time Spent Directly with Patient:  I have spent a total of 25 minutes with the patient reviewing hospital notes, telemetry, EKGs, labs and examining the patient as well as establishing an assessment and plan that was discussed personally with the patient.  > 50% of time was spent in direct patient care.  Length of Stay:  LOS: 2 days   Chrystie Nose, MD, [facility], FACP  [place]  [facility]  Medical Director of the [facility]  [facility] Diplomate of the American Board of Clinical Lipidology Attending Cardiologist  Direct Dial: [phone]  Fax: [phone]  Website:  [email or web address] Nicely Ezequiel Macauley 08/19/2023, 9:42 AM

## 2023-08-19 NOTE — Progress Notes (Signed)
°  Progress Note   Patient: [name] WUJ:[id number] DOB: 1986-03-15 DOA: 08/16/2023     2 DOS: the patient was seen and examined on 08/19/2023 at       Brief hospital course: 37 y.o. F with hx alcohol use who presented with few days nausea, vomiting and abdominal pain.  In the ER, K 2.6, Trop 301, and so admitted for further evaluation.     Assessment and Plan: * Cardiomyopathy (HCC) Myocardial injury Alcohol-related Co. myopathy versus Takotsubo versus less likely ischemic, but agree cardiology needs workup -Continue aspirin, losartan, metoprolol - Consult cardiology, appreciate recommendations - Plan for angiography tomorrow   Hypokalemia Supplement potassium  Alcoholic gastritis Still vomiting a lot.  Recommend overnight coverage to avoid opiates - Continue PPI, sucralfate - Hotpack - Ondansetron or Phenergan - IV fluids     Alcohol withdrawal (HCC) She denies withdrawal to me, but she is telling nursing that she is having withdrawal symptoms and seeking lorazepam - Continue CIWA scoring for now - Continue thiamine and folate          Subjective: Vomited few times this morning, not able to keep down any liquids.  Still having abdominal cramps in her abdominal muscles and vomiting frequently.  No intra-abdominal pain, no fever, does feel hot and cold sweats, but says she is not having alcohol withdrawal.  No headache, no confusion.  No chest pain, no dyspnea.       Physical Exam: BP (!) 143/110   Pulse (!) 103   Temp 98.9 F (37.2 C) (Oral)   Resp 16   Ht 5\' 7"  (1.702 m)   Wt 62.6 kg   LMP 07/22/2023 (Approximate)   SpO2 98%   BMI 21.62 kg/m   Thin adult female, sitting up in bed, interactive and appropriate Tachycardic, regular, no murmurs, no peripheral edema, no JVD Respiratory rate normal, lungs clear without rales or wheezes No abdominal distention, no tenderness to palpation Attention normal, affect normal, judgment and insight appear  normal    Data Reviewed: Discussed with cardiology team Basic metabolic panel shows potassium down to 3 CBC normal    Family Communication: Boyfriend at the bedside    Disposition: Status is: Inpatient Still unable to take anything by mouth Will have to continue IV fluids, hopefully angiography will be reassuring tomorrow, and she can advance her diet and go home        Author: Alberteen Sam, MD 08/19/2023 3:16 PM  For on call review [email or web address].

## 2023-08-19 NOTE — Plan of Care (Signed)
°  Problem: Activity: Goal: Ability to tolerate increased activity will improve Outcome: Progressing   Problem: Cardiac: Goal: Ability to achieve and maintain adequate cardiovascular perfusion will improve Outcome: Progressing   Problem: Clinical Measurements: Goal: Respiratory complications will improve Outcome: Progressing Goal: Cardiovascular complication will be avoided Outcome: Progressing   Problem: Activity: Goal: Risk for activity intolerance will decrease Outcome: Progressing   Problem: Safety: Goal: Ability to remain free from injury will improve Outcome: Progressing

## 2023-08-20 ENCOUNTER — Encounter (HOSPITAL_COMMUNITY)
Admission: EM | Disposition: A | Discharge status: Home / Self Care | Payer: Self-pay | Source: Home / Self Care | Attending: Family Medicine

## 2023-08-20 DIAGNOSIS — F10930 Alcohol use, unspecified with withdrawal, uncomplicated: Secondary | ICD-10-CM | POA: Diagnosis not present

## 2023-08-20 DIAGNOSIS — I428 Other cardiomyopathies: Secondary | ICD-10-CM

## 2023-08-20 DIAGNOSIS — I214 Non-ST elevation (NSTEMI) myocardial infarction: Secondary | ICD-10-CM

## 2023-08-20 DIAGNOSIS — R7989 Other specified abnormal findings of blood chemistry: Secondary | ICD-10-CM | POA: Diagnosis not present

## 2023-08-20 DIAGNOSIS — K2920 Alcoholic gastritis without bleeding: Secondary | ICD-10-CM | POA: Diagnosis not present

## 2023-08-20 DIAGNOSIS — I429 Cardiomyopathy, unspecified: Secondary | ICD-10-CM | POA: Diagnosis not present

## 2023-08-20 DIAGNOSIS — E876 Hypokalemia: Secondary | ICD-10-CM | POA: Diagnosis not present

## 2023-08-20 HISTORY — PX: LEFT HEART CATH AND CORONARY ANGIOGRAPHY: CATH118249

## 2023-08-20 LAB — CBC
HCT: 43.7 % (ref 36.0–46.0)
Hemoglobin: 14.6 g/dL (ref 12.0–15.0)
MCH: 32.5 pg (ref 26.0–34.0)
MCHC: 33.4 g/dL (ref 30.0–36.0)
MCV: 97.3 fL (ref 80.0–100.0)
Platelets: 152 10*3/uL (ref 150–400)
RBC: 4.49 MIL/uL (ref 3.87–5.11)
RDW: 12.6 % (ref 11.5–15.5)
WBC: 5.5 10*3/uL (ref 4.0–10.5)
nRBC: 0 % (ref 0.0–0.2)

## 2023-08-20 LAB — COMPREHENSIVE METABOLIC PANEL
ALT: 11 U/L (ref 0–44)
AST: 18 U/L (ref 15–41)
Albumin: 3.3 g/dL — ABNORMAL LOW (ref 3.5–5.0)
Alkaline Phosphatase: 44 U/L (ref 38–126)
Anion gap: 10 (ref 5–15)
BUN: <5 mg/dL — ABNORMAL LOW (ref 6–20)
CO2: 28 mmol/L (ref 22–32)
Calcium: 8.8 mg/dL — ABNORMAL LOW (ref 8.9–10.3)
Chloride: 99 mmol/L (ref 98–111)
Creatinine, Ser: 0.73 mg/dL (ref 0.44–1.00)
GFR, Estimated: >60 mL/min (ref >60)
Glucose, Bld: 109 mg/dL — ABNORMAL HIGH (ref 70–99)
Potassium: 3.1 mmol/L — ABNORMAL LOW (ref 3.5–5.1)
Sodium: 137 mmol/L (ref 135–145)
Total Bilirubin: 0.9 mg/dL (ref <1.2)
Total Protein: 6.2 g/dL — ABNORMAL LOW (ref 6.5–8.1)

## 2023-08-20 LAB — LIPID PANEL
Cholesterol: 143 mg/dL (ref 0–200)
HDL: 31 mg/dL — ABNORMAL LOW (ref >40)
LDL Cholesterol: 88 mg/dL (ref 0–99)
Total CHOL/HDL Ratio: 4.6 {ratio}
Triglycerides: 119 mg/dL (ref <150)
VLDL: 24 mg/dL (ref 0–40)

## 2023-08-20 SURGERY — LEFT HEART CATH AND CORONARY ANGIOGRAPHY
Anesthesia: LOCAL

## 2023-08-20 MED ORDER — ENOXAPARIN SODIUM 40 MG/0.4ML IJ SOSY
40.0000 mg | PREFILLED_SYRINGE | INTRAMUSCULAR | Status: DC
  Administered 2023-08-21: 40 mg via SUBCUTANEOUS
  Filled 2023-08-20: qty 0.4

## 2023-08-20 MED ORDER — SODIUM CHLORIDE 0.9% FLUSH
3.0000 mL | Freq: Two times a day (BID) | INTRAVENOUS | Status: DC
Start: 2023-08-20 — End: 2023-08-21

## 2023-08-20 MED ORDER — IOHEXOL 350 MG/ML SOLN
INTRAVENOUS | Status: DC | PRN
  Administered 2023-08-20: 30 mL

## 2023-08-20 MED ORDER — SODIUM CHLORIDE 0.9 % IV SOLN: INTRAVENOUS | Status: AC

## 2023-08-20 MED ORDER — METOPROLOL SUCCINATE ER 50 MG PO TB24
50.0000 mg | ORAL_TABLET | Freq: Every day | ORAL | Status: DC
  Administered 2023-08-21: 50 mg via ORAL
  Filled 2023-08-20: qty 1

## 2023-08-20 MED ORDER — FOLIC ACID 1 MG PO TABS
1.0000 mg | ORAL_TABLET | Freq: Every day | ORAL | Status: DC
  Administered 2023-08-20 – 2023-08-21 (×2): 1 mg via ORAL
  Filled 2023-08-20 (×2): qty 1

## 2023-08-20 MED ORDER — MIDAZOLAM HCL 2 MG/2ML IJ SOLN
INTRAMUSCULAR | Status: DC | PRN
  Administered 2023-08-20: 2 mg via INTRAVENOUS

## 2023-08-20 MED ORDER — LABETALOL HCL 5 MG/ML IV SOLN: 10.0000 mg | INTRAVENOUS | Status: AC | PRN

## 2023-08-20 MED ORDER — SODIUM CHLORIDE 0.9 % IV SOLN: 250.0000 mL | INTRAVENOUS | Status: DC | PRN

## 2023-08-20 MED ORDER — POTASSIUM CHLORIDE CRYS ER 20 MEQ PO TBCR
40.0000 meq | EXTENDED_RELEASE_TABLET | Freq: Once | ORAL | Status: AC
  Administered 2023-08-20: 40 meq via ORAL
  Filled 2023-08-20: qty 2

## 2023-08-20 MED ORDER — VERAPAMIL HCL 2.5 MG/ML IV SOLN
INTRAVENOUS | Status: AC
  Filled 2023-08-20: qty 2

## 2023-08-20 MED ORDER — MIDAZOLAM HCL 2 MG/2ML IJ SOLN
INTRAMUSCULAR | Status: AC
  Filled 2023-08-20: qty 2

## 2023-08-20 MED ORDER — HEPARIN SODIUM (PORCINE) 1000 UNIT/ML IJ SOLN
INTRAMUSCULAR | Status: AC
  Filled 2023-08-20: qty 10

## 2023-08-20 MED ORDER — HEPARIN SODIUM (PORCINE) 1000 UNIT/ML IJ SOLN
INTRAMUSCULAR | Status: DC | PRN
  Administered 2023-08-20: 3000 [IU] via INTRAVENOUS

## 2023-08-20 MED ORDER — FENTANYL CITRATE (PF) 100 MCG/2ML IJ SOLN
INTRAMUSCULAR | Status: AC
  Filled 2023-08-20: qty 2

## 2023-08-20 MED ORDER — SODIUM CHLORIDE 0.9% FLUSH: 3.0000 mL | INTRAVENOUS | Status: DC | PRN

## 2023-08-20 MED ORDER — HEPARIN (PORCINE) IN NACL 1000-0.9 UT/500ML-% IV SOLN
INTRAVENOUS | Status: DC | PRN
  Administered 2023-08-20 (×2): 500 mL

## 2023-08-20 MED ORDER — VERAPAMIL HCL 2.5 MG/ML IV SOLN
INTRAVENOUS | Status: DC | PRN
  Administered 2023-08-20: 10 mL via INTRA_ARTERIAL

## 2023-08-20 MED ORDER — FENTANYL CITRATE (PF) 100 MCG/2ML IJ SOLN
INTRAMUSCULAR | Status: DC | PRN
  Administered 2023-08-20: 50 ug via INTRAVENOUS

## 2023-08-20 MED ORDER — HYDRALAZINE HCL 20 MG/ML IJ SOLN: 10.0000 mg | INTRAMUSCULAR | Status: AC | PRN

## 2023-08-20 MED ORDER — ASPIRIN 81 MG PO CHEW: 81.0000 mg | CHEWABLE_TABLET | ORAL | Status: DC

## 2023-08-20 MED ORDER — LIDOCAINE HCL (PF) 1 % IJ SOLN
INTRAMUSCULAR | Status: DC | PRN
  Administered 2023-08-20: 2 mL

## 2023-08-20 MED ORDER — POTASSIUM CHLORIDE 10 MEQ/100ML IV SOLN
10.0000 meq | INTRAVENOUS | Status: AC
  Administered 2023-08-20 (×2): 10 meq via INTRAVENOUS
  Filled 2023-08-20 (×2): qty 100

## 2023-08-20 SURGICAL SUPPLY — 7 items
CATH 5FR JL3.5 JR4 ANG PIG MP (CATHETERS) IMPLANT
DEVICE RAD COMP TR BAND LRG (VASCULAR PRODUCTS) IMPLANT
GLIDESHEATH SLEND SS 6F .021 (SHEATH) IMPLANT
GUIDEWIRE INQWIRE 1.5J.035X260 (WIRE) IMPLANT
INQWIRE 1.5J .035X260CM (WIRE) ×1
PACK CARDIAC CATHETERIZATION (CUSTOM PROCEDURE TRAY) ×1 IMPLANT
SET ATX-X65L (MISCELLANEOUS) IMPLANT

## 2023-08-20 NOTE — Progress Notes (Signed)
Transition of Care [facility]) - Inpatient Brief Assessment   Patient Details  Name: [name] MRN: [id number] Date of Birth: 1986/07/30  [facility] of [facility]) CM/SW Contact:    Ronny Bacon, RN Phone Number: 08/20/2023, 3:40 PM   Clinical Narrative: Patient from home with c/o abdominal pain with nausea and vomiting. Pt had abnormal ECHO and elevated troponin. Patient to have Cath done. Patient does not have primary care provider listed but has heath insurance.   Transition of Care Asessment: Insurance and Status: (P) Insurance coverage has been reviewed Patient has primary care physician: (P) No Home environment has been reviewed: (P) Apartment Prior level of function:: (P) Independent Prior/Current Home Services: (P) No current home services Social Drivers of Health Review: (P) SDOH reviewed no interventions necessary Readmission risk has been reviewed: (P) Yes Transition of care needs: (P) no transition of care needs at this time

## 2023-08-20 NOTE — Plan of Care (Signed)
  Problem: Activity: Goal: Ability to tolerate increased activity will improve Outcome: Progressing   Problem: Cardiac: Goal: Ability to achieve and maintain adequate cardiovascular perfusion will improve Outcome: Progressing   Problem: Health Behavior/Discharge Planning: Goal: Ability to manage health-related needs will improve Outcome: Progressing   Problem: Clinical Measurements: Goal: Ability to maintain clinical measurements within normal limits will improve Outcome: Progressing Goal: Respiratory complications will improve Outcome: Progressing Goal: Cardiovascular complication will be avoided Outcome: Progressing   Problem: Pain Management: Goal: General experience of comfort will improve Outcome: Progressing   Problem: Safety: Goal: Ability to remain free from injury will improve Outcome: Progressing

## 2023-08-20 NOTE — H&P (View-Only) (Signed)
DAILY PROGRESS NOTE   Patient Name: [name] Date of Encounter: 08/20/2023 Cardiologist: None  Chief Complaint   ETOH w/d, NSTEMI  Patient Profile   [name] is a 37 y.o. female with a hx of marijuana and EtOH abuse who is being seen 08/18/2023 for the evaluation of abnormal echo and elevated trop at the request of Dr. Maryfrances Bunnell.   Subjective   No chest pain overnight, no shortness of breath, feels much more in control of herself.  After thorough discussion, she prefers cardiac cath over coronary CT to avoid the chance of having to have 2 procedures.  Objective   Vitals:   08/19/23 2000 08/19/23 2135 08/19/23 2300 08/20/23 0415  BP: 114/84 126/87  97/67  Pulse: 100 (!) 105 71 79  Resp: 18  18 18   Temp: 98.4 F (36.9 C)   98.1 F (36.7 C)  TempSrc: Oral   Oral  SpO2: 98%  99% 97%  Weight:      Height:        Intake/Output Summary (Last 24 hours) at 08/20/2023 0805 Last data filed at 08/20/2023 2536 Gross per 24 hour  Intake 1849.99 ml  Output 300 ml  Net 1549.99 ml   Filed Weights   08/16/23 1544 08/17/23 1614  Weight: 63.5 kg 62.6 kg    Physical Exam   General: Well developed, well nourished, female in no acute distress Head: Eyes PERRLA, Head normocephalic and atraumatic Lungs: clear bilaterally to auscultation. Heart: HRRR S1 S2, without rub or gallop. No murmur. 4/4 extremity pulses are 2+ & equal. No JVD. Abdomen: Bowel sounds are present, abdomen soft and non-tender without masses or  hernias noted. Msk: Normal strength and tone for age. Extremities: No clubbing, cyanosis or edema.    Skin:  No rashes or lesions noted. Neuro: Alert and oriented X 3. Psych:  Good affect, responds appropriately   Inpatient Medications    Scheduled Meds:  aspirin  324 mg Oral Once   aspirin EC  81 mg Oral Daily   enoxaparin (LOVENOX) injection  40 mg Subcutaneous Q24H   feeding supplement  1 Container Oral TID BM   folic acid  1 mg Oral Daily    losartan  25 mg Oral Daily   metoprolol tartrate  25 mg Oral BID   multivitamin with minerals  1 tablet Oral Daily   pantoprazole  40 mg Oral BID   sucralfate  1 g Oral TID WC & HS   thiamine  100 mg Oral Daily    Continuous Infusions:  dextrose 5 % with KCl 20 mEq / L 20 mEq (08/20/23 0334)   promethazine (PHENERGAN) injection (IM or IVPB) Stopped (08/19/23 1310)    PRN Meds: acetaminophen, ondansetron (ZOFRAN) IV, promethazine (PHENERGAN) injection (IM or IVPB)   Labs   Results for orders placed or performed during the hospital encounter of 08/16/23 (from the past 48 hours)  Troponin I (High Sensitivity)     Status: Abnormal   Collection Time: 08/18/23  8:53 AM  Result Value Ref Range   Troponin I (High Sensitivity) 62 (H) <18 ng/L    Comment: DELTA CHECK NOTED (NOTE) Elevated high sensitivity troponin I (hsTnI) values and significant  changes across serial measurements may suggest ACS but many other  chronic and acute conditions are known to elevate hsTnI results.  Refer to the "Links" section for chest pain algorithms and additional  guidance. Performed at [facility], [address] [place], [place] [address]  CBC     Status: Abnormal   Collection Time: 08/19/23  3:10 AM  Result Value Ref Range   WBC 5.3 4.0 - 10.5 K/uL   RBC 3.80 (L) 3.87 - 5.11 MIL/uL   Hemoglobin 12.7 12.0 - 15.0 g/dL   HCT 81.1 91.4 - 78.2 %   MCV 98.2 80.0 - 100.0 fL   MCH 33.4 26.0 - 34.0 pg   MCHC 34.0 30.0 - 36.0 g/dL   RDW 95.6 21.3 - 08.6 %   Platelets 148 (L) 150 - 400 K/uL   nRBC 0.0 0.0 - 0.2 %    Comment: Performed at [facility], [address] [place], [place] [address]  Comprehensive metabolic panel     Status: Abnormal   Collection Time: 08/19/23  3:10 AM  Result Value Ref Range   Sodium 135 135 - 145 mmol/L   Potassium 3.0 (L) 3.5 - 5.1 mmol/L   Chloride 100 98 - 111 mmol/L   CO2 26 22 - 32 mmol/L   Glucose, Bld 89 70 - 99 mg/dL    Comment: Glucose  reference range applies only to samples taken after fasting for at least 8 hours.   BUN <5 (L) 6 - 20 mg/dL   Creatinine, Ser 9.62 0.44 - 1.00 mg/dL   Calcium 8.4 (L) 8.9 - 10.3 mg/dL   Total Protein 5.5 (L) 6.5 - 8.1 g/dL   Albumin 2.9 (L) 3.5 - 5.0 g/dL   AST 14 (L) 15 - 41 U/L   ALT 10 0 - 44 U/L   Alkaline Phosphatase 41 38 - 126 U/L   Total Bilirubin 1.1 <1.2 mg/dL   GFR, Estimated >95 >28 mL/min    Comment: (NOTE) Calculated using the CKD-EPI Creatinine Equation (2021)    Anion gap 9 5 - 15    Comment: Performed at [facility], [address] [place], [place] [address]  CBC     Status: None   Collection Time: 08/20/23  4:05 AM  Result Value Ref Range   WBC 5.5 4.0 - 10.5 K/uL   RBC 4.49 3.87 - 5.11 MIL/uL   Hemoglobin 14.6 12.0 - 15.0 g/dL   HCT 40.1 02.7 - 25.3 %   MCV 97.3 80.0 - 100.0 fL   MCH 32.5 26.0 - 34.0 pg   MCHC 33.4 30.0 - 36.0 g/dL   RDW 66.4 40.3 - 47.4 %   Platelets 152 150 - 400 K/uL   nRBC 0.0 0.0 - 0.2 %    Comment: Performed at [facility], [address], [place], [place] [address]  Comprehensive metabolic panel     Status: Abnormal   Collection Time: 08/20/23  4:05 AM  Result Value Ref Range   Sodium 137 135 - 145 mmol/L   Potassium 3.1 (L) 3.5 - 5.1 mmol/L   Chloride 99 98 - 111 mmol/L   CO2 28 22 - 32 mmol/L   Glucose, Bld 109 (H) 70 - 99 mg/dL    Comment: Glucose reference range applies only to samples taken after fasting for at least 8 hours.   BUN <5 (L) 6 - 20 mg/dL   Creatinine, Ser 3.87 0.44 - 1.00 mg/dL   Calcium 8.8 (L) 8.9 - 10.3 mg/dL   Total Protein 6.2 (L) 6.5 - 8.1 g/dL   Albumin 3.3 (L) 3.5 - 5.0 g/dL   AST 18 15 - 41 U/L   ALT 11 0 - 44 U/L   Alkaline Phosphatase 44 38 -  126 U/L   Total Bilirubin 0.9 <1.2 mg/dL   GFR, Estimated >60 >45 mL/min    Comment: (NOTE) Calculated using the CKD-EPI Creatinine Equation (2021)    Anion gap 10 5 - 15    Comment: Performed at [facility], [address],  [place], [place] [address]    ECG   None today  Telemetry   SR, ST - Personally Reviewed  Radiology    No results found.   Cardiac Studies   ECHO: 08/17/2023  1. Akinesis of the basal inferior and inferoseptal walls; overall mild LV dysfunction.   2. Left ventricular ejection fraction, by estimation, is 45 to 50%. The left ventricle has mildly decreased function. The left ventricle demonstrates regional wall motion abnormalities (see scoring diagram/findings for description). Left ventricular  diastolic parameters were normal.   3. Right ventricular systolic function is normal. The right ventricular size is normal.   4. The mitral valve is normal in structure. Mild mitral valve  regurgitation. No evidence of mitral stenosis.   5. The aortic valve is tricuspid. Aortic valve regurgitation is not  visualized. No aortic stenosis is present.   6. The inferior vena cava is normal in size with greater than 50% respiratory variability, suggesting right atrial pressure of 3 mmHg.   Assessment and Plan   Cardiomyopathy - EF 45-50% w/ WMA and elevated cardiac enzymes with a peak troponin of 318. -With decreased EF and wall motion abnormalities on her echo as well as cardiac risk factors of EtOH and drug use -Lipid profile not done yet, will check -Patient prefers cardiac cath over coronary CT, to avoid the chance of having to have 2 procedures.  She is also not a good coronary CT candidate as her heart rate tends to run high and her blood pressure does not give [place] much room to add rate controlling meds -Continue ASA and beta-blocker -Hx EtOH abuse, but LFTs were actually normal on admission, discuss adding statin with MD  2.  Cardiomyopathy -She is on metoprolol 25 mg twice daily -Losartan 25 mg daily was held today for cath. -Other GDMT as blood pressure will allow  3.  Hypokalemia: -Her potassium was 2.9 on admission. -She had been having problems with nausea and vomiting related to  EtOH withdrawal -She has had extensive potassium supplementation, she got 4 runs yesterday. -After the 4 runs, her potassium level went from 3.0->3.1 -Will give 2 more runs today +40 mEq p.o. -She has IV fluids with 20 mEq of potassium in them running at 100 mL/h, continue this  4.  EtOH withdrawal and alcohol gastritis -These and other issues per IM   Length of Stay:  LOS: 3 days   Theodore Demark 08/20/2023, 8:05 AM  Patient seen and examined with RB PA-C.  Agree as above, with the following exceptions and changes as noted below. Pt noted to have septal regional wall motion abnormalities on echo, presenting with ETOH w/d and DOE. No chest pain reported. Troponin was elevated. I'm concerned about SCAD, and if not SCAD possibly stress cardiomyopathy. We need to exclude the former. CCTA may not be diagnostic given current HR and if we want to ensure evaluation for SCAD, cath may be  more appropriate. RWMA, troponin elevation both noted.   INFORMED CONSENT: I have reviewed the risks, indications, and alternatives to cardiac catheterization, possible angioplasty, and stenting with the patient. Risks include but are not limited to bleeding, infection, vascular injury, stroke, myocardial infarction, arrhythmia, kidney injury, radiation-related  injury in the case of prolonged fluoroscopy use, emergency cardiac surgery, and death. The patient understands the risks of serious complication is 1-2 in 1000 with diagnostic cardiac cath and 1-2% or less with angioplasty/stenting.   Gen: NAD, CV: RRR, no murmurs, Lungs: clear, Abd: soft, Extrem: Warm, well perfused, no edema, Neuro/Psych: alert and oriented x 3, normal mood and affect. All available labs, radiology testing, previous records reviewed. Pt agreeable to proceed and understands rationale.   Parke Poisson, MD 08/20/23 12:02 PM

## 2023-08-20 NOTE — Plan of Care (Signed)
  Problem: Education: Goal: Understanding of cardiac disease, CV risk reduction, and recovery process will improve Outcome: Progressing Goal: Individualized Educational Video(s) Outcome: Progressing   Problem: Activity: Goal: Ability to tolerate increased activity will improve Outcome: Progressing   Problem: Cardiac: Goal: Ability to achieve and maintain adequate cardiovascular perfusion will improve Outcome: Progressing   Problem: Health Behavior/Discharge Planning: Goal: Ability to safely manage health-related needs after discharge will improve Outcome: Progressing   Problem: Health Behavior/Discharge Planning: Goal: Ability to manage health-related needs will improve Outcome: Progressing   Problem: Clinical Measurements: Goal: Ability to maintain clinical measurements within normal limits will improve Outcome: Progressing Goal: Will remain free from infection Outcome: Progressing

## 2023-08-20 NOTE — Interval H&P Note (Signed)
History and Physical Interval Note:  08/20/2023 3:59 PM  [name]  has presented today for surgery, with the diagnosis of n-stemi.  The various methods of treatment have been discussed with the patient and family. After consideration of risks, benefits and other options for treatment, the patient has consented to  Procedure(s): LEFT HEART CATH AND CORONARY ANGIOGRAPHY (N/A) as a surgical intervention.  The patient's history has been reviewed, patient examined, no change in status, stable for surgery.  I have reviewed the patient's chart and labs.  Questions were answered to the patient's satisfaction.    Cath Lab Visit (complete for each Cath Lab visit)  Clinical Evaluation Leading to the Procedure:   ACS: Yes.    Non-ACS:    Anginal Classification: CCS II  Anti-ischemic medical therapy: No Therapy  Non-Invasive Test Results: No non-invasive testing performed  Prior CABG: No previous CABG        Verne Carrow

## 2023-08-20 NOTE — Progress Notes (Addendum)
DAILY PROGRESS NOTE   Patient Name: [name] Date of Encounter: 08/20/2023 Cardiologist: None  Chief Complaint   ETOH w/d, NSTEMI  Patient Profile   [name] is a 37 y.o. female with a hx of marijuana and EtOH abuse who is being seen 08/18/2023 for the evaluation of abnormal echo and elevated trop at the request of Dr. Maryfrances Bunnell.   Subjective   No chest pain overnight, no shortness of breath, feels much more in control of herself.  After thorough discussion, she prefers cardiac cath over coronary CT to avoid the chance of having to have 2 procedures.  Objective   Vitals:   08/19/23 2000 08/19/23 2135 08/19/23 2300 08/20/23 0415  BP: 114/84 126/87  97/67  Pulse: 100 (!) 105 71 79  Resp: 18  18 18   Temp: 98.4 F (36.9 C)   98.1 F (36.7 C)  TempSrc: Oral   Oral  SpO2: 98%  99% 97%  Weight:      Height:        Intake/Output Summary (Last 24 hours) at 08/20/2023 0805 Last data filed at 08/20/2023 2536 Gross per 24 hour  Intake 1849.99 ml  Output 300 ml  Net 1549.99 ml   Filed Weights   08/16/23 1544 08/17/23 1614  Weight: 63.5 kg 62.6 kg    Physical Exam   General: Well developed, well nourished, female in no acute distress Head: Eyes PERRLA, Head normocephalic and atraumatic Lungs: clear bilaterally to auscultation. Heart: HRRR S1 S2, without rub or gallop. No murmur. 4/4 extremity pulses are 2+ & equal. No JVD. Abdomen: Bowel sounds are present, abdomen soft and non-tender without masses or  hernias noted. Msk: Normal strength and tone for age. Extremities: No clubbing, cyanosis or edema.    Skin:  No rashes or lesions noted. Neuro: Alert and oriented X 3. Psych:  Good affect, responds appropriately   Inpatient Medications    Scheduled Meds:  aspirin  324 mg Oral Once   aspirin EC  81 mg Oral Daily   enoxaparin (LOVENOX) injection  40 mg Subcutaneous Q24H   feeding supplement  1 Container Oral TID BM   folic acid  1 mg Oral Daily    losartan  25 mg Oral Daily   metoprolol tartrate  25 mg Oral BID   multivitamin with minerals  1 tablet Oral Daily   pantoprazole  40 mg Oral BID   sucralfate  1 g Oral TID WC & HS   thiamine  100 mg Oral Daily    Continuous Infusions:  dextrose 5 % with KCl 20 mEq / L 20 mEq (08/20/23 0334)   promethazine (PHENERGAN) injection (IM or IVPB) Stopped (08/19/23 1310)    PRN Meds: acetaminophen, ondansetron (ZOFRAN) IV, promethazine (PHENERGAN) injection (IM or IVPB)   Labs   Results for orders placed or performed during the hospital encounter of 08/16/23 (from the past 48 hours)  Troponin I (High Sensitivity)     Status: Abnormal   Collection Time: 08/18/23  8:53 AM  Result Value Ref Range   Troponin I (High Sensitivity) 62 (H) <18 ng/L    Comment: DELTA CHECK NOTED (NOTE) Elevated high sensitivity troponin I (hsTnI) values and significant  changes across serial measurements may suggest ACS but many other  chronic and acute conditions are known to elevate hsTnI results.  Refer to the "Links" section for chest pain algorithms and additional  guidance. Performed at [facility], [address] [place], [place] [address]  CBC     Status: Abnormal   Collection Time: 08/19/23  3:10 AM  Result Value Ref Range   WBC 5.3 4.0 - 10.5 K/uL   RBC 3.80 (L) 3.87 - 5.11 MIL/uL   Hemoglobin 12.7 12.0 - 15.0 g/dL   HCT 81.1 91.4 - 78.2 %   MCV 98.2 80.0 - 100.0 fL   MCH 33.4 26.0 - 34.0 pg   MCHC 34.0 30.0 - 36.0 g/dL   RDW 95.6 21.3 - 08.6 %   Platelets 148 (L) 150 - 400 K/uL   nRBC 0.0 0.0 - 0.2 %    Comment: Performed at [facility], [address] [place], [place] [address]  Comprehensive metabolic panel     Status: Abnormal   Collection Time: 08/19/23  3:10 AM  Result Value Ref Range   Sodium 135 135 - 145 mmol/L   Potassium 3.0 (L) 3.5 - 5.1 mmol/L   Chloride 100 98 - 111 mmol/L   CO2 26 22 - 32 mmol/L   Glucose, Bld 89 70 - 99 mg/dL    Comment: Glucose  reference range applies only to samples taken after fasting for at least 8 hours.   BUN <5 (L) 6 - 20 mg/dL   Creatinine, Ser 9.62 0.44 - 1.00 mg/dL   Calcium 8.4 (L) 8.9 - 10.3 mg/dL   Total Protein 5.5 (L) 6.5 - 8.1 g/dL   Albumin 2.9 (L) 3.5 - 5.0 g/dL   AST 14 (L) 15 - 41 U/L   ALT 10 0 - 44 U/L   Alkaline Phosphatase 41 38 - 126 U/L   Total Bilirubin 1.1 <1.2 mg/dL   GFR, Estimated >95 >28 mL/min    Comment: (NOTE) Calculated using the CKD-EPI Creatinine Equation (2021)    Anion gap 9 5 - 15    Comment: Performed at [facility], [address] [place], [place] [address]  CBC     Status: None   Collection Time: 08/20/23  4:05 AM  Result Value Ref Range   WBC 5.5 4.0 - 10.5 K/uL   RBC 4.49 3.87 - 5.11 MIL/uL   Hemoglobin 14.6 12.0 - 15.0 g/dL   HCT 40.1 02.7 - 25.3 %   MCV 97.3 80.0 - 100.0 fL   MCH 32.5 26.0 - 34.0 pg   MCHC 33.4 30.0 - 36.0 g/dL   RDW 66.4 40.3 - 47.4 %   Platelets 152 150 - 400 K/uL   nRBC 0.0 0.0 - 0.2 %    Comment: Performed at [facility], [address], [place], [place] [address]  Comprehensive metabolic panel     Status: Abnormal   Collection Time: 08/20/23  4:05 AM  Result Value Ref Range   Sodium 137 135 - 145 mmol/L   Potassium 3.1 (L) 3.5 - 5.1 mmol/L   Chloride 99 98 - 111 mmol/L   CO2 28 22 - 32 mmol/L   Glucose, Bld 109 (H) 70 - 99 mg/dL    Comment: Glucose reference range applies only to samples taken after fasting for at least 8 hours.   BUN <5 (L) 6 - 20 mg/dL   Creatinine, Ser 3.87 0.44 - 1.00 mg/dL   Calcium 8.8 (L) 8.9 - 10.3 mg/dL   Total Protein 6.2 (L) 6.5 - 8.1 g/dL   Albumin 3.3 (L) 3.5 - 5.0 g/dL   AST 18 15 - 41 U/L   ALT 11 0 - 44 U/L   Alkaline Phosphatase 44 38 -  126 U/L   Total Bilirubin 0.9 <1.2 mg/dL   GFR, Estimated >60 >45 mL/min    Comment: (NOTE) Calculated using the CKD-EPI Creatinine Equation (2021)    Anion gap 10 5 - 15    Comment: Performed at [facility], [address],  [place], [place] [address]    ECG   None today  Telemetry   SR, ST - Personally Reviewed  Radiology    No results found.   Cardiac Studies   ECHO: 08/17/2023  1. Akinesis of the basal inferior and inferoseptal walls; overall mild LV dysfunction.   2. Left ventricular ejection fraction, by estimation, is 45 to 50%. The left ventricle has mildly decreased function. The left ventricle demonstrates regional wall motion abnormalities (see scoring diagram/findings for description). Left ventricular  diastolic parameters were normal.   3. Right ventricular systolic function is normal. The right ventricular size is normal.   4. The mitral valve is normal in structure. Mild mitral valve  regurgitation. No evidence of mitral stenosis.   5. The aortic valve is tricuspid. Aortic valve regurgitation is not  visualized. No aortic stenosis is present.   6. The inferior vena cava is normal in size with greater than 50% respiratory variability, suggesting right atrial pressure of 3 mmHg.   Assessment and Plan   Cardiomyopathy - EF 45-50% w/ WMA and elevated cardiac enzymes with a peak troponin of 318. -With decreased EF and wall motion abnormalities on her echo as well as cardiac risk factors of EtOH and drug use -Lipid profile not done yet, will check -Patient prefers cardiac cath over coronary CT, to avoid the chance of having to have 2 procedures.  She is also not a good coronary CT candidate as her heart rate tends to run high and her blood pressure does not give [place] much room to add rate controlling meds -Continue ASA and beta-blocker -Hx EtOH abuse, but LFTs were actually normal on admission, discuss adding statin with MD  2.  Cardiomyopathy -She is on metoprolol 25 mg twice daily -Losartan 25 mg daily was held today for cath. -Other GDMT as blood pressure will allow  3.  Hypokalemia: -Her potassium was 2.9 on admission. -She had been having problems with nausea and vomiting related to  EtOH withdrawal -She has had extensive potassium supplementation, she got 4 runs yesterday. -After the 4 runs, her potassium level went from 3.0->3.1 -Will give 2 more runs today +40 mEq p.o. -She has IV fluids with 20 mEq of potassium in them running at 100 mL/h, continue this  4.  EtOH withdrawal and alcohol gastritis -These and other issues per IM   Length of Stay:  LOS: 3 days   Theodore Demark 08/20/2023, 8:05 AM  Patient seen and examined with RB PA-C.  Agree as above, with the following exceptions and changes as noted below. Pt noted to have septal regional wall motion abnormalities on echo, presenting with ETOH w/d and DOE. No chest pain reported. Troponin was elevated. I'm concerned about SCAD, and if not SCAD possibly stress cardiomyopathy. We need to exclude the former. CCTA may not be diagnostic given current HR and if we want to ensure evaluation for SCAD, cath may be  more appropriate. RWMA, troponin elevation both noted.   INFORMED CONSENT: I have reviewed the risks, indications, and alternatives to cardiac catheterization, possible angioplasty, and stenting with the patient. Risks include but are not limited to bleeding, infection, vascular injury, stroke, myocardial infarction, arrhythmia, kidney injury, radiation-related  injury in the case of prolonged fluoroscopy use, emergency cardiac surgery, and death. The patient understands the risks of serious complication is 1-2 in 1000 with diagnostic cardiac cath and 1-2% or less with angioplasty/stenting.   Gen: NAD, CV: RRR, no murmurs, Lungs: clear, Abd: soft, Extrem: Warm, well perfused, no edema, Neuro/Psych: alert and oriented x 3, normal mood and affect. All available labs, radiology testing, previous records reviewed. Pt agreeable to proceed and understands rationale.   Parke Poisson, MD 08/20/23 12:02 PM

## 2023-08-20 NOTE — Progress Notes (Signed)
  Progress Note   Patient: [name] EXB:[id number] DOB: [birth year] DOA: 08/16/2023     3 DOS: the patient was seen and examined on 08/20/2023 at 10:21AM and 5:45PM      Brief hospital course: 37 y.o. F with hx alcohol use who presented with few days nausea, vomiting and abdominal pain.  In the ER, K 2.6, Trop 301, and so admitted for further evaluation.     Assessment and Plan: * Cardiomyopathy (HCC) Myocardial injury Given RWMA and troponin, patient underwent LHC today, thankfully, no CAD.   - Continue metoprolol - Will need GDMT, plans for follow up titration/echo with Cardiology prior to discharge   Hyponatremia Mild.  Alcoholic gastritis Seems better. - PPI and sucralfate - Recommend abstinence - ADAT   Hypokalemia - Supp K  Alcohol withdrawal (HCC) None          Subjective: Hasn't eaten yet, was vomiting yesterday aftenroon, not yet tolerating PO well.  No CP, no dyspnea.  Had cath today, just got back.     Physical Exam: BP 116/85   Pulse 92   Temp 99.1 F (37.3 C) (Oral)   Resp 18   Ht 5\' 7"  (1.702 m)   Wt 62.6 kg   LMP 07/22/2023 (Approximate)   SpO2 99%   BMI 21.62 kg/m   Thin adult female, sitting up in bed, interactive and appropriate Tachycardic, regular, no murmurs, no peripheral edema, no JVD Respiratory rate normal, lungs clear without rales or wheezes No abdominal distention, no tenderness to palpation Attention normal, affect normal, judgment and insight appear normal  Data Reviewed: BMP shows low K CBC normal LHC showed no significant coronary disease    Family Communication: Partner at bedside    Disposition: Status is: Inpatient Patient will need follow up with Cardiology post-cath and advancement of diet, hopefully home in AM        Author: Alberteen Sam, MD 08/20/2023 8:15 PM  For on call review [email or web address].

## 2023-08-21 ENCOUNTER — Other Ambulatory Visit (HOSPITAL_COMMUNITY): Payer: Self-pay

## 2023-08-21 ENCOUNTER — Encounter (HOSPITAL_COMMUNITY): Payer: Self-pay | Admitting: Cardiovascular Disease

## 2023-08-21 ENCOUNTER — Telehealth (HOSPITAL_COMMUNITY): Payer: Self-pay | Admitting: Pharmacy Technician

## 2023-08-21 DIAGNOSIS — I429 Cardiomyopathy, unspecified: Secondary | ICD-10-CM | POA: Diagnosis not present

## 2023-08-21 LAB — BASIC METABOLIC PANEL
Anion gap: 11 (ref 5–15)
BUN: <5 mg/dL — ABNORMAL LOW (ref 6–20)
CO2: 22 mmol/L (ref 22–32)
Calcium: 9.2 mg/dL (ref 8.9–10.3)
Chloride: 101 mmol/L (ref 98–111)
Creatinine, Ser: 0.68 mg/dL (ref 0.44–1.00)
GFR, Estimated: >60 mL/min (ref >60)
Glucose, Bld: 99 mg/dL (ref 70–99)
Potassium: 3.7 mmol/L (ref 3.5–5.1)
Sodium: 134 mmol/L — ABNORMAL LOW (ref 135–145)

## 2023-08-21 MED ORDER — METOPROLOL SUCCINATE ER 50 MG PO TB24
50.0000 mg | ORAL_TABLET | Freq: Every day | ORAL | 3 refills | Status: AC
  Filled 2023-08-21: qty 30, 30d supply, fill #0

## 2023-08-21 MED ORDER — SACUBITRIL-VALSARTAN 24-26 MG PO TABS: 1.0000 | ORAL_TABLET | Freq: Two times a day (BID) | ORAL | Status: DC

## 2023-08-21 MED ORDER — PANTOPRAZOLE SODIUM 20 MG PO TBEC
20.0000 mg | DELAYED_RELEASE_TABLET | Freq: Every day | ORAL | 3 refills | Status: DC
  Filled 2023-08-21: qty 30, 30d supply, fill #0

## 2023-08-21 MED ORDER — SACUBITRIL-VALSARTAN 24-26 MG PO TABS
1.0000 | ORAL_TABLET | Freq: Two times a day (BID) | ORAL | 3 refills | Status: AC
  Filled 2023-08-21: qty 60, 30d supply, fill #0

## 2023-08-21 NOTE — Progress Notes (Signed)
   Heart Failure Stewardship Pharmacist Progress Note   PCP: Patient, No Pcp Per PCP-Cardiologist: None    HPI:  37 yo F with PMH of alcohol and THC use.   Presented to the ED on 12/26 with abdominal pain for 4 days. Intractable nausea and vomiting thought to be secondary to cannabinoid hyperemesis syndrome or gastritis. Trop was elevated in the ED 300s. EKG without and ST changes. ECHO 12/27 showed LVEF 45-50%, RWMA, RV normal, mild MR. Reported she had been feeling new shortness of breath with exertion but no LE edema. Taken for [facility] on 12/20 and found to have no evidence of CAD. LVEDP 14.   Reports improvement in symptoms. Eager to go home.   Discharge HF Medications: Beta Blocker: metoprolol  XL 50 mg daily ACE/ARB/ARNI: Entresto  24/26 mg BID  Prior to admission HF Medications: None  Pertinent Lab Values: Serum creatinine 0.68, BUN <5, Potassium 3.7, Sodium 134, Magnesium  2.3   Vital Signs: Weight: 138 lbs (admission weight: 138 lbs) Blood pressure: 100/70s  Heart rate: 100s  I/O: incomplete  Medication Assistance / Insurance Benefits Check: Does the patient have prescription insurance?  Yes Type of insurance plan: [place] Medicaid  Outpatient Pharmacy:  Prior to admission outpatient [facility] Is the patient willing to use [facility] pharmacy at discharge? Yes Is the patient willing to transition their outpatient pharmacy to utilize a [facility] outpatient pharmacy?   No    Assessment: 1. 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 NYHA class II symptoms. - Not volume overloaded on exam - Agree with metoprolol  XL 50 mg daily and Entresto  24/26 mg BID at discharge - Consider adding SGLT2i at follow up   Plan: 1) Medication changes recommended at this time: - Add SGLT2i at follow up  2) Patient assistance: - Entresto  copay $4  3)  Education  - Patient has been educated on current HF medications and potential additions to HF medication regimen - Patient verbalizes understanding  that over the next few months, these medication doses may change and more medications may be added to optimize HF regimen - Patient has been educated on basic disease state pathophysiology and goals of therapy   Duwaine Plant, PharmD, BCPS Heart Failure Stewardship Pharmacist Phone [phone]

## 2023-08-21 NOTE — Progress Notes (Signed)
 Heart Failure Nurse Navigator Progress Note  PCP: Patient, No Pcp Per PCP-Cardiologist: None Admission Diagnosis: Nausea, vomiting, dehydration, and abdominal pain.  Admitted from: Home via EMS  Presentation:   [name] presented with lower abdominal pain,. 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   Patient was educated at length on the sign and symptoms of heart failure, daily weights, when to call her doctor or go to the ED, Diet/ fluid restrictions, Patient reported to drinking a lot of alcohol for years and especially since Thanksgiving, she also stated she loves salt and drinks Mt. Dew and Dr. Nunzio) Education done on substance cessation , alcohol and reports to smoking marijuana regularly) taking all medications as prescribed and attending all medical appointments. Patient verbalized her understanding of education provided and her interest in quitting both her drinking and smoking, she wants to get herself better. A HF TOC appointment was scheduled for 08/27/2023 @ 9 am.   ECHO/ LVEF: 45-50% New  Clinical Course:  Past Medical History:  Diagnosis Date   Bartholin's cyst      Social History   Socioeconomic History   Marital status: Single    Spouse name: Not on file   Number of children: Not on file   Years of education: Not on file   Highest education level: Not on file  Occupational History   Not on file  Tobacco Use   Smoking status: Never   Smokeless tobacco: Never  Vaping Use   Vaping status: Former  Substance and Sexual Activity   Alcohol use: Yes    Alcohol/week: 14.0 standard drinks of alcohol    Types: 2 Glasses of wine, 12 Cans of beer per week   Drug use: Yes    Types: Marijuana   Sexual activity: Not Currently  Other Topics Concern   Not on file  Social History Narrative   Not on file   Social Drivers of  Health   Financial Resource Strain: Not on file  Food Insecurity: No Food Insecurity (08/17/2023)   Hunger Vital Sign    Worried About Running Out of Food in the Last Year: Never true    Ran Out of Food in the Last Year: Never true  Transportation Needs: Unmet Transportation Needs (08/17/2023)   PRAPARE - Administrator, Civil Service (Medical): Yes    Lack of Transportation (Non-Medical): Yes  Physical Activity: Not on file  Stress: Not on file  Social Connections: Not on file   Education Assessment and Provision:  Detailed education and instructions provided on heart failure disease management including the following:  Signs and symptoms of Heart Failure When to call the physician Importance of daily weights Low sodium diet Fluid restriction Medication management Anticipated future follow-up appointments  Patient education given on each of the above topics.  Patient acknowledges understanding via teach back method and acceptance of all instructions.  Education Materials:  Living Better With Heart Failure Booklet, HF zone tool, & Daily Weight Tracker Tool.  Patient has scale at home: No, buying one at discharge Patient has pill box at home: yes    High Risk Criteria for Readmission and/or Poor Patient Outcomes: Heart failure hospital admissions (last 6 months): 0  No Show rate: 38% Difficult social situation: No, lives with her friend in apartment.  Demonstrates medication adherence: yes Primary Language: English Literacy level: Reading, writing, and  comprehension.  Barriers of Care:   New HF diagnosis Diet/ fluid restrictions ( salt, soda ( Mt. Dew)  Daily weights Substance abuse ( alcohol)   Considerations/Referrals:   Referral made to Heart Failure Pharmacist Stewardship: Yes Referral made to Heart Failure CSW/NCM TOC: Yes, Substance cessation ( Alcohol)  Referral made to Heart & Vascular TOC clinic: yes, 08/27/2023 @ 9 am.   Items for Follow-up on  DC/TOC: Continued HF education Diet/ fluid restrictions ( salt, Mt. Dew) Daily weights Substance cessation ( alcohol)    Stephane Haddock, BSN, RN Heart Failure Teacher, Adult Education Only

## 2023-08-21 NOTE — Progress Notes (Signed)
 Discharge instructions reviewed with pt.  Copy of instructions given to pt. [facility] has filled pt's scripts and will be picked up on the way down to the discharge lounge. Pt has called her ride.  Pt will be d/c'd via wheelchair with belongings, and will be escorted by staff to the discharge lounge.   Hillery Zachman,RN SWOT

## 2023-08-21 NOTE — Telephone Encounter (Signed)
 Patient Product/process development scientist completed.    The patient is insured through [employer]  [place].     Ran test claim for Entresto  24-26 mg and the current 30 day co-pay is $4.00.   This test claim was processed through [facility]- copay amounts may vary at other pharmacies due to pharmacy/plan contracts, or as the patient moves through the different stages of their insurance plan.     Reyes Sharps, CPHT Pharmacy Technician III Certified Patient Advocate [facility] Patient Advocate Team Direct Number: [phone]  Fax: [phone]

## 2023-08-21 NOTE — Plan of Care (Signed)
  Problem: Education: Goal: Understanding of cardiac disease, CV risk reduction, and recovery process will improve Outcome: Progressing   Problem: Clinical Measurements: Goal: Diagnostic test results will improve Outcome: Progressing   Problem: Activity: Goal: Risk for activity intolerance will decrease Outcome: Progressing   Problem: Nutrition: Goal: Adequate nutrition will be maintained Outcome: Progressing

## 2023-08-21 NOTE — Progress Notes (Addendum)
 Patient Name: [name] Date of Encounter: 08/21/2023 [facility] Cardiologist: New to Dr Mona   Interval Summary  .    37 yr old female with PMH of ETOH abuse, marijuana abuse, who is admitted for alcoholic gastritis and NSTEMI.   Patient states she feels so much better now, wants to go home. She states she is going to stop drinking ETOH, has been sober for a week. She is determined to never drink again.   Vital Signs .    Vitals:   08/20/23 2033 08/21/23 0644 08/21/23 0757 08/21/23 0804  BP: 128/72 110/84 105/73 105/73  Pulse:   (!) 101 89  Resp: 18 18 16    Temp: 98.5 F (36.9 C) 98.5 F (36.9 C) 98.3 F (36.8 C)   TempSrc: Oral Oral Oral   SpO2:   100%   Weight:      Height:        Intake/Output Summary (Last 24 hours) at 08/21/2023 0905 Last data filed at 08/20/2023 2102 Gross per 24 hour  Intake 240 ml  Output --  Net 240 ml      08/17/2023    4:14 PM 08/16/2023    3:44 PM 08/14/2023   11:34 PM  Last 3 Weights  Weight (lbs) 138 lb 0.1 oz 139 lb 15.9 oz 140 lb  Weight (kg) 62.6 kg 63.5 kg 63.504 kg      Telemetry/ECG    Sinus tachycardia 120s  - Personally Reviewed  Physical Exam .   GEN: No acute distress. Ambulating independently    Neck: No JVD Cardiac: Regular rhythm, tachycardiac, no murmurs, rubs, or gallops.  Respiratory: Clear to auscultation bilaterally. On room air. Speaks full sentence.  GI: Soft, nontender, non-distended  MS: No leg edema Right radial site without bleeding or hematoma, pulse 2+, warm, no neurovascular deficit  Assessment & Plan .     Non-ischemic cardiomyopathy  -Presented to the hospital initially for abdominal pain and vomiting, felt due to alcoholic gastritis, high sensitive troponin 301 >318 >62 concerning for non-STEMI, underwent left heart catheterization 08/20/2023 revealing no evidence of CAD, LVEDP 14 mmHg; echocardiogram 12/70/24 showed LVEF 45 to 50%,Akinesis of the basal inferior and  inferoseptal walls, normal diastolic function, normal RV, mild MR, normal IVC -Patient reports grandfather had history of CHF, personal history of heavy alcohol use >15-17 years -Etiology of cardiomyopathy likely due to alcohol abuse, discussed need of support group and alcohol cessation  - Post cath site care discussed in detail, written instruction given, see AVS -GDMT: start Toprol  XL 50mg  daily, stop losartan  today, start entresto  24/26mg  BID tomorrow, if tolerating well and demonstrate compliance with meds and ETOH cessation , plan to add spironolactone and SGLT2I outpatient - Will repeat Echo in 4-6 weeks  - Follow up: will refer to [facility] HF impact clinic, will arrange follow up and assist medication needs as she has no insurance, refer to AHF clinic if appropriate; follow up with [facility] office arranged 09/03/23 (she has moved to [facility] location), see AVS   Sinus tachycardia  - continue Toprolol XL, recommend ETOH cessation   ETOH abuse/withdrawal  Alcoholic gastritis  - per primary team     For questions or updates, please contact [facility] Please consult [email or web address] for contact info under        Signed, Xika Zhao, NP   Patient seen and examined with [facility] NP.  Agree as above, with the following exceptions and changes as noted below. Feeling better no recurrent  chest pain no CAD or scad, likely stress cardiomyopathy. Gen: NAD, CV: RRR, no murmurs, Lungs: clear, Abd: soft, Extrem: Warm, well perfused, no edema, Neuro/Psych: alert and oriented x 3, normal mood and affect. All available labs, radiology testing, previous records reviewed. Recommend starting Hf therapy with metoprolol  succ, entresto , and agree as above can initiate remainder as outpatient to facilitate pt discharge.   Denzil Bristol A Saron Vanorman, MD 08/21/23 2:46 PM

## 2023-08-22 NOTE — Discharge Summary (Signed)
 Physician Discharge Summary   Patient: [name] MRN: [id number] DOB: 07-10-1986  Admit date:     08/16/2023  Discharge date: 08/21/2023  Discharge Physician: Lonni SHAUNNA Dalton   PCP: Patient, No Pcp Per     Recommendations at discharge:  Follow up with TOC HF clinic Follow up with Cardiology arranged, Josefa Beauvais Please obtain BMP at hospital follow up     Discharge Diagnoses: Principal Problem:   Cardiomyopathy [facility]) Active Problems:   Alcoholic gastritis   Hyponatremia   Hypokalemia    Hospital Course: 38 y.o. F with hx alcohol use who presented with few days nausea, vomiting and abdominal pain.  In the ER, K 2.6, Trop 301, and so admitted for further evaluation.      * Cardiomyopathy (HCC) Myocardial injury Admitted for vomiting, found incidentally to have troponin leak.  Follow-up echocardiogram shows EF 45 to 50%, regional wall motion abnormalities.  Underwent left heart catheter showed no evidence of ischemic heart disease.  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  Discharged on beta-blocker, Entresto , cardiology follow-up closely.     Hyponatremia Mild, asymptomatic  Alcoholic gastritis Treated with PPI and sucralfate .  Symptoms resolved.  Alcohol cessation recommended.  Alcohol withdrawal, ruled out           [name]  Controlled Substances Registry was reviewed for this patient prior to discharge.  Consultants: Cardiology Procedures performed:  Echocardiogram Left heart cath  Disposition: Home Diet recommendation:  Discharge Diet Orders (From admission, onward)     Start     Ordered   08/21/23 0000  Diet - low sodium heart healthy        08/21/23 1319             DISCHARGE MEDICATION: Allergies as of 08/21/2023       Reactions   Morphine  And Codeine Swelling   Per pt 08/17/23 has arm swelling after IV dose, happened > 5 yrs ok and resolved quickly, she's  willing to try again        Medication List     STOP taking these medications    naproxen  500 MG tablet Commonly known as: NAPROSYN    ondansetron  4 MG disintegrating tablet Commonly known as: ZOFRAN -ODT   ondansetron  4 MG tablet Commonly known as: ZOFRAN    sucralfate  1 g tablet Commonly known as: Carafate        TAKE these medications    Entresto  24-26 MG Generic drug: sacubitril -valsartan  Take 1 tablet by mouth 2 (two) times daily.   famotidine  40 MG tablet Commonly known as: PEPCID  Take 1 tablet (40 mg total) by mouth daily.   levonorgestrel 20 MCG/24HR IUD Commonly known as: MIRENA 1 each by Intrauterine route once.   levonorgestrel 20 MCG/DAY Iud Commonly known as: MIRENA 1 each by Intrauterine route once.   metoprolol  succinate 50 MG 24 hr tablet Commonly known as: TOPROL -XL Take 1 tablet (50 mg total) by mouth daily. Take with or immediately following a meal.   pantoprazole  20 MG tablet Commonly known as: PROTONIX  Take 1 tablet (20 mg total) by mouth daily.        Follow-up Information     Beauvais Josefa HERO, NP Follow up on 09/03/2023.   Specialty: Cardiology Why: at 8:25 am for your follow up appointment with cardiology Contact information: [address] [place] [address] [phone]         [facility]. Go in 6 day(s).  Specialty: Cardiology Why: Hospital follow up 08/27/2023 @ 9 am PLEASE bring a current medication list to appointment FREE valet parking, Entrance C, off [employer] information: [address] [place]  [phone]                Discharge Instructions     Diet - low sodium heart healthy   Complete by: As directed    Discharge instructions   Complete by: As directed    **IMPORTANT DISCHARGE INSTRUCTIONS**   From [name]: You were admitted for vomiting, which we believe was from alcohol gastritis  (stomach irritation caused by alcohol)   This is a condition that gets better with alcohol cessation  You may take pantoprazole  (an acid suppressing medicine) for a few weeks or months if you would like, this might help Stopping alcohol is key  In addition, we found that your heart squeeze was weak You had a heart cath that showed no blockages in the arteries  Likely this is alcohol related  Take the following two medicines: Metoprolol  50 mg daily Entresto  24-26 mg TWICE daily (morning and night)  Go see the heart and vascular center here at [facility] on Jan 6  If appropriate, they will connect you with the advanced heart failure clinic  If not, then go to the appointment on Jan 13 with Josefa Beauvais at the [facility] Card[facility] clinic  They will refill your mediicnes and repeat your echo (heart ultrasound) in the future   Increase activity slowly   Complete by: As directed        Discharge Exam: Filed Weights   08/16/23 1544 08/17/23 1614  Weight: 63.5 kg 62.6 kg    General: Pt is alert, awake, not in acute distress Cardiovascular: RRR, nl S1-S2, no murmurs appreciated.   No LE edema.   Respiratory: Normal respiratory rate and rhythm.  CTAB without rales or wheezes. Abdominal: Abdomen soft and non-tender.  No distension or HSM.   Neuro/Psych: Strength symmetric in upper and lower extremities.  Judgment and insight appear normal.   Condition at discharge: good  The results of significant diagnostics from this hospitalization (including imaging, microbiology, ancillary and laboratory) are listed below for reference.   Imaging Studies: CARDIAC CATHETERIZATION Result Date: 08/20/2023 No angiographic evidence of CAD LVEDP 14 mmHg Non-ischemic cardiomyopathy Recommendations: No further ischemic workup.   ECHOCARDIOGRAM COMPLETE Result Date: 08/17/2023    ECHOCARDIOGRAM REPORT   Patient Name:   [name] Date of Exam: 08/17/2023 Medical Rec #:  [id number]     Height:        67.0 in Accession #:    [id number]    Weight:       140.0 lb Date of Birth:  1986-08-04     BSA:          1.738 m Patient Age:    38 years      BP:           105/79 mmHg Patient Gender: F             HR:           85 bpm. Exam Location:  Inpatient Procedure: 2D Echo, Cardiac Doppler and Color Doppler Indications:    Elevated Troponin  History:        Patient has prior history of Echocardiogram examinations, most                 recent 03/04/2009.  Sonographer:    Ozell Free Referring  Phys: 33 JARED M GARDNER IMPRESSIONS  1. Akinesis of the basal inferior and inferoseptal walls; overall mild LV dysfunction.  2. Left ventricular ejection fraction, by estimation, is 45 to 50%. The left ventricle has mildly decreased function. The left ventricle demonstrates regional wall motion abnormalities (see scoring diagram/findings for description). Left ventricular diastolic parameters were normal.  3. Right ventricular systolic function is normal. The right ventricular size is normal.  4. The mitral valve is normal in structure. Mild mitral valve regurgitation. No evidence of mitral stenosis.  5. The aortic valve is tricuspid. Aortic valve regurgitation is not visualized. No aortic stenosis is present.  6. The inferior vena cava is normal in size with greater than 50% respiratory variability, suggesting right atrial pressure of 3 mmHg. Comparison(s): No prior Echocardiogram. FINDINGS  Left Ventricle: Left ventricular ejection fraction, by estimation, is 45 to 50%. The left ventricle has mildly decreased function. The left ventricle demonstrates regional wall motion abnormalities. The left ventricular internal cavity size was normal in size. There is no left ventricular hypertrophy. Left ventricular diastolic parameters were normal. Right Ventricle: The right ventricular size is normal. Right ventricular systolic function is normal. Left Atrium: Left atrial size was normal in size. Right Atrium: Right atrial size was normal in  size. Pericardium: There is no evidence of pericardial effusion. Mitral Valve: The mitral valve is normal in structure. Mild mitral valve regurgitation. No evidence of mitral valve stenosis. Tricuspid Valve: The tricuspid valve is normal in structure. Tricuspid valve regurgitation is trivial. No evidence of tricuspid stenosis. Aortic Valve: The aortic valve is tricuspid. Aortic valve regurgitation is not visualized. No aortic stenosis is present. Aortic valve mean gradient measures 5.0 mmHg. Aortic valve peak gradient measures 9.4 mmHg. Aortic valve area, by VTI measures 2.02 cm. Pulmonic Valve: The pulmonic valve was normal in structure. Pulmonic valve regurgitation is trivial. No evidence of pulmonic stenosis. Aorta: The aortic root is normal in size and structure. Venous: The inferior vena cava is normal in size with greater than 50% respiratory variability, suggesting right atrial pressure of 3 mmHg. IAS/Shunts: No atrial level shunt detected by color flow Doppler. Additional Comments: Akinesis of the basal inferior and inferoseptal walls; overall mild LV dysfunction.  LEFT VENTRICLE PLAX 2D LVIDd:         5.00 cm     Diastology LVIDs:         3.90 cm     LV e' medial:    8.38 cm/s LV PW:         1.00 cm     LV E/e' medial:  10.3 LV IVS:        0.90 cm     LV e' lateral:   11.40 cm/s LVOT diam:     1.90 cm     LV E/e' lateral: 7.6 LV SV:         57 LV SV Index:   33 LVOT Area:     2.84 cm  LV Volumes (MOD) LV vol d, MOD A2C: 90.6 ml LV vol d, MOD A4C: 98.9 ml LV vol s, MOD A2C: 48.2 ml LV vol s, MOD A4C: 57.8 ml LV SV MOD A2C:     42.4 ml LV SV MOD A4C:     98.9 ml LV SV MOD BP:      42.3 ml RIGHT VENTRICLE             IVC RV Basal diam:  3.50 cm     IVC diam: 1.70 cm  RV S prime:     15.90 cm/s TAPSE (M-mode): 2.5 cm LEFT ATRIUM           Index        RIGHT ATRIUM           Index LA diam:      3.00 cm 1.73 cm/m   RA Area:     11.80 cm LA Vol (A2C): 56.5 ml 32.51 ml/m  RA Volume:   26.50 ml  15.25 ml/m LA  Vol (A4C): 33.9 ml 19.51 ml/m  AORTIC VALVE AV Area (Vmax):    1.89 cm AV Area (Vmean):   1.95 cm AV Area (VTI):     2.02 cm AV Vmax:           153.00 cm/s AV Vmean:          106.000 cm/s AV VTI:            0.281 m AV Peak Grad:      9.4 mmHg AV Mean Grad:      5.0 mmHg LVOT Vmax:         102.00 cm/s LVOT Vmean:        72.900 cm/s LVOT VTI:          0.200 m LVOT/AV VTI ratio: 0.71  AORTA Ao Root diam: 2.70 cm Ao Asc diam:  2.60 cm MITRAL VALVE MV Area (PHT): 3.83 cm    SHUNTS MV Decel Time: 198 msec    Systemic VTI:  0.20 m MR Peak grad: 103.0 mmHg   Systemic Diam: 1.90 cm MR Mean grad: 66.0 mmHg MR Vmax:      507.50 cm/s MR Vmean:     384.0 cm/s MV E velocity: 86.10 cm/s MV A velocity: 65.60 cm/s MV E/A ratio:  1.31 Redell Shallow MD Electronically signed by Redell Shallow MD Signature Date/Time: 08/17/2023/12:15:40 PM    Final     Microbiology: Results for orders placed or performed during the hospital encounter of 04/19/21  Resp Panel by RT-PCR (Flu A&B, Covid) Nasopharyngeal Swab     Status: None   Collection Time: 04/19/21  5:15 AM   Specimen: Nasopharyngeal Swab; Nasopharyngeal(NP) swabs in vial transport medium  Result Value Ref Range Status   SARS Coronavirus 2 by RT PCR NEGATIVE NEGATIVE Final    Comment: (NOTE) SARS-CoV-2 target nucleic acids are NOT DETECTED.  The SARS-CoV-2 RNA is generally detectable in upper respiratory specimens during the acute phase of infection. The lowest concentration of SARS-CoV-2 viral copies this assay can detect is 138 copies/mL. A negative result does not preclude SARS-Cov-2 infection and should not be used as the sole basis for treatment or other patient management decisions. A negative result may occur with  improper specimen collection/handling, submission of specimen other than nasopharyngeal swab, presence of viral mutation(s) within the areas targeted by this assay, and inadequate number of viral copies(<138 copies/mL). A negative result must  be combined with clinical observations, patient history, and epidemiological information. The expected result is Negative.  Fact Sheet for Patients:  bloggercourse.com  Fact Sheet for Healthcare Providers:  seriousbroker.it  This test is no t yet approved or cleared by the [place]  FDA and  has been authorized for detection and/or diagnosis of SARS-CoV-2 by FDA under an Emergency Use Authorization (EUA). This EUA will remain  in effect (meaning this test can be used) for the duration of the COVID-19 declaration under Section 564(b)(1) of the Act, 21 U.S.C.section 360bbb-3(b)(1), unless the authorization is terminated  or revoked  sooner.       Influenza A by PCR NEGATIVE NEGATIVE Final   Influenza B by PCR NEGATIVE NEGATIVE Final    Comment: (NOTE) The Xpert Xpress SARS-CoV-2/FLU/RSV plus assay is intended as an aid in the diagnosis of influenza from Nasopharyngeal swab specimens and should not be used as a sole basis for treatment. Nasal washings and aspirates are unacceptable for Xpert Xpress SARS-CoV-2/FLU/RSV testing.  Fact Sheet for Patients: bloggercourse.com  Fact Sheet for Healthcare Providers: seriousbroker.it  This test is not yet approved or cleared by the [place]  FDA and has been authorized for detection and/or diagnosis of SARS-CoV-2 by FDA under an Emergency Use Authorization (EUA). This EUA will remain in effect (meaning this test can be used) for the duration of the COVID-19 declaration under Section 564(b)(1) of the Act, 21 U.S.C. section 360bbb-3(b)(1), unless the authorization is terminated or revoked.  Performed at [facility], [address] [place], [place] [address]     Labs: CBC: Recent Labs  Lab 08/16/23 1629 08/17/23 0901 08/19/23 0310 08/20/23 0405  WBC 14.3* 9.0 5.3 5.5  NEUTROABS 13.2* 7.9*  --   --   HGB 15.4* 13.2 12.7  14.6  HCT 47.2* 39.9 37.3 43.7  MCV 100.2* 101.5* 98.2 97.3  PLT 276 179 148* 152   Basic Metabolic Panel: Recent Labs  Lab 08/16/23 1629 08/16/23 2140 08/17/23 0901 08/19/23 0310 08/20/23 0405 08/21/23 0455  NA 133*  --  134* 135 137 134*  K 2.6*  --  4.1 3.0* 3.1* 3.7  CL 94*  --  103 100 99 101  CO2 26  --  23 26 28 22   GLUCOSE 149*  --  100* 89 109* 99  BUN 10  --  7 <5* <5* <5*  CREATININE 0.61  --  0.57 0.60 0.73 0.68  CALCIUM  9.0  --  8.2* 8.4* 8.8* 9.2  MG  --  1.6* 2.3  --   --   --    Liver Function Tests: Recent Labs  Lab 08/16/23 1629 08/19/23 0310 08/20/23 0405  AST 28 14* 18  ALT 15 10 11   ALKPHOS 69 41 44  BILITOT 1.4* 1.1 0.9  PROT 8.9* 5.5* 6.2*  ALBUMIN 4.7 2.9* 3.3*   CBG: No results for input(s): GLUCAP in the last 168 hours.  Discharge time spent: approximately 45 minutes spent on discharge counseling, evaluation of patient on day of discharge, and coordination of discharge planning with nursing, social work, pharmacy and case management  Signed: Lonni SHAUNNA Dalton, MD [facility] 08/22/2023

## 2023-08-23 ENCOUNTER — Telehealth (HOSPITAL_COMMUNITY): Payer: Self-pay | Admitting: Cardiology

## 2023-08-23 ENCOUNTER — Other Ambulatory Visit: Payer: Self-pay

## 2023-08-23 ENCOUNTER — Encounter (HOSPITAL_COMMUNITY): Payer: Self-pay | Admitting: Emergency Medicine

## 2023-08-23 ENCOUNTER — Emergency Department (HOSPITAL_COMMUNITY): Payer: MEDICAID

## 2023-08-23 ENCOUNTER — Emergency Department (HOSPITAL_COMMUNITY)
Admission: EM | Admit: 2023-08-23 | Discharge: 2023-08-23 | Disposition: A | Discharge status: Home / Self Care | Payer: MEDICAID | Attending: Emergency Medicine | Admitting: Emergency Medicine

## 2023-08-23 DIAGNOSIS — R0602 Shortness of breath: Secondary | ICD-10-CM | POA: Diagnosis not present

## 2023-08-23 DIAGNOSIS — R1084 Generalized abdominal pain: Secondary | ICD-10-CM | POA: Insufficient documentation

## 2023-08-23 DIAGNOSIS — R112 Nausea with vomiting, unspecified: Secondary | ICD-10-CM

## 2023-08-23 DIAGNOSIS — R079 Chest pain, unspecified: Secondary | ICD-10-CM | POA: Diagnosis present

## 2023-08-23 DIAGNOSIS — R0789 Other chest pain: Secondary | ICD-10-CM

## 2023-08-23 DIAGNOSIS — F419 Anxiety disorder, unspecified: Secondary | ICD-10-CM | POA: Insufficient documentation

## 2023-08-23 LAB — COMPREHENSIVE METABOLIC PANEL
ALT: 19 U/L (ref 0–44)
AST: 22 U/L (ref 15–41)
Albumin: 4.1 g/dL (ref 3.5–5.0)
Alkaline Phosphatase: 44 U/L (ref 38–126)
Anion gap: 11 (ref 5–15)
BUN: 10 mg/dL (ref 6–20)
CO2: 19 mmol/L — ABNORMAL LOW (ref 22–32)
Calcium: 9 mg/dL (ref 8.9–10.3)
Chloride: 104 mmol/L (ref 98–111)
Creatinine, Ser: 0.49 mg/dL (ref 0.44–1.00)
GFR, Estimated: >60 mL/min (ref >60)
Glucose, Bld: 128 mg/dL — ABNORMAL HIGH (ref 70–99)
Potassium: 3.2 mmol/L — ABNORMAL LOW (ref 3.5–5.1)
Sodium: 134 mmol/L — ABNORMAL LOW (ref 135–145)
Total Bilirubin: 0.6 mg/dL (ref 0.0–1.2)
Total Protein: 7.2 g/dL (ref 6.5–8.1)

## 2023-08-23 LAB — CBC WITH DIFFERENTIAL/PLATELET
Abs Immature Granulocytes: 0.02 10*3/uL (ref 0.00–0.07)
Basophils Absolute: 0 10*3/uL (ref 0.0–0.1)
Basophils Relative: 0 %
Eosinophils Absolute: 0 10*3/uL (ref 0.0–0.5)
Eosinophils Relative: 0 %
HCT: 40.2 % (ref 36.0–46.0)
Hemoglobin: 13.3 g/dL (ref 12.0–15.0)
Immature Granulocytes: 0 %
Lymphocytes Relative: 6 %
Lymphs Abs: 0.5 10*3/uL — ABNORMAL LOW (ref 0.7–4.0)
MCH: 33.2 pg (ref 26.0–34.0)
MCHC: 33.1 g/dL (ref 30.0–36.0)
MCV: 100.2 fL — ABNORMAL HIGH (ref 80.0–100.0)
Monocytes Absolute: 0.5 10*3/uL (ref 0.1–1.0)
Monocytes Relative: 6 %
Neutro Abs: 6.4 10*3/uL (ref 1.7–7.7)
Neutrophils Relative %: 88 %
Platelets: 236 10*3/uL (ref 150–400)
RBC: 4.01 MIL/uL (ref 3.87–5.11)
RDW: 12.8 % (ref 11.5–15.5)
WBC: 7.4 10*3/uL (ref 4.0–10.5)
nRBC: 0 % (ref 0.0–0.2)

## 2023-08-23 LAB — LIPASE, BLOOD: Lipase: 49 U/L (ref 11–51)

## 2023-08-23 LAB — HCG, QUANTITATIVE, PREGNANCY: hCG, Beta Chain, Quant, S: <1 m[IU]/mL (ref <5)

## 2023-08-23 LAB — D-DIMER, QUANTITATIVE: D-Dimer, Quant: 0.27 ug{FEU}/mL (ref 0.00–0.50)

## 2023-08-23 LAB — TROPONIN I (HIGH SENSITIVITY)
Troponin I (High Sensitivity): 14 ng/L (ref <18)
Troponin I (High Sensitivity): 20 ng/L — ABNORMAL HIGH (ref <18)

## 2023-08-23 MED ORDER — POTASSIUM CHLORIDE CRYS ER 20 MEQ PO TBCR
40.0000 meq | EXTENDED_RELEASE_TABLET | Freq: Once | ORAL | Status: DC
  Filled 2023-08-23: qty 2

## 2023-08-23 MED ORDER — SODIUM CHLORIDE 0.9 % IV BOLUS
1000.0000 mL | Freq: Once | INTRAVENOUS | Status: AC
  Administered 2023-08-23: 1000 mL via INTRAVENOUS

## 2023-08-23 MED ORDER — ONDANSETRON HCL 4 MG PO TABS: 4.0000 mg | ORAL_TABLET | Freq: Four times a day (QID) | ORAL | 0 refills | Status: DC

## 2023-08-23 MED ORDER — DROPERIDOL 2.5 MG/ML IJ SOLN
1.2500 mg | Freq: Once | INTRAMUSCULAR | Status: AC
  Administered 2023-08-23: 1.25 mg via INTRAVENOUS
  Filled 2023-08-23: qty 2

## 2023-08-23 NOTE — ED Provider Triage Note (Signed)
 Emergency Medicine Provider Triage Evaluation Note  [name] , a 38 y.o. female  was evaluated in triage.  Pt complains of vomiting.  Review of Systems  Positive:  Negative:  Physical Exam  BP (!) 159/129 (BP Location: Right Arm)   Pulse 91   Temp 97.7 [name] (36.5 C) (Oral)   Resp 18   LMP 07/22/2023 (Approximate)   SpO2 100%  Gen:   Awake, no distress   Resp:  Normal effort  MSK:   Moves extremities without difficulty  Other:    Medical Decision Making  Medically screening exam initiated at 10:19 AM.  Appropriate orders placed.  [name] was informed that the remainder of the evaluation will be completed by another provider, this initial triage assessment does not replace that evaluation, and the importance of remaining in the ED until their evaluation is complete  Patient is sitting on ground rather than the 2 chairs provided in triage room. Concerned for nausea and vomiting since 8AM this morning. Denies abdominal pain. Subjective fever and chills. Denies any other infectious symptoms today.    [name], [place] 08/23/23 1020

## 2023-08-23 NOTE — Discharge Instructions (Addendum)
 Evaluation today was overall reassuring.  Symptoms today could be related to recent marijuana use and/or viral GI illness.  Treatment at home is the same which is supportive.  This includes rest hydration and I also sent Zofran  to your pharmacy for nausea and vomiting.  Your EKG and cardiac enzymes are normal.  Your overall reassuring as well.  Please follow-up with your PCP

## 2023-08-23 NOTE — ED Triage Notes (Signed)
 Pt arrives via EMS from home. PT woke up this morning with nausea, vomiting, anxiety, and was hyperventilating. EMS administered 4mg  zofran. Pt AxOx4.

## 2023-08-23 NOTE — ED Provider Notes (Signed)
 [place] EMERGENCY DEPARTMENT AT [facility] Provider Note   CSN: [id number] Arrival date & time: 08/23/23  9046     History  Chief Complaint  Patient presents with   Emesis   Nausea   Anxiety   HPI Artis Buechele is a 38 y.o. 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  Started this morning.  States she does have abdominal pain all over.  Denies fever.  Also states she has chest pain and shortness of breath that started this morning as well.  States the chest pain is all over her chest.  It is otherwise nonradiating, nonexertional and nonpleuritic per patient states she is extremely anxious right now.  States she has not used THC products in at least 2 days.  Denies recent alcohol use.   Emesis Anxiety       Home Medications Prior to Admission medications   Medication Sig Start Date End Date Taking? Authorizing Provider  ondansetron  (ZOFRAN ) 4 MG tablet Take 1 tablet (4 mg total) by mouth every 6 (six) hours. 08/23/23  Yes Lang Norleen POUR, PA-C  famotidine  (PEPCID ) 40 MG tablet Take 1 tablet (40 mg total) by mouth daily. 11/21/22   Francesca Elsie CROME, MD  levonorgestrel (MIRENA) 20 MCG/24HR IUD 1 each by Intrauterine route once.    [provider]  levonorgestrel (MIRENA) 20 MCG/DAY IUD 1 each by Intrauterine route once.    [provider]  metoprolol  succinate (TOPROL -XL) 50 MG 24 hr tablet Take 1 tablet (50 mg total) by mouth daily. Take with or immediately following a meal. 08/22/23   Danford, Lonni SQUIBB, MD  pantoprazole  (PROTONIX ) 20 MG tablet Take 1 tablet (20 mg total) by mouth daily. 08/21/23   Danford, Lonni SQUIBB, MD  sacubitril -valsartan  (ENTRESTO ) 24-26 MG Take 1 tablet by mouth 2 (two) times daily. 08/22/23   Danford, Lonni SQUIBB, MD      Allergies    Morphine  and codeine     Review of Systems   Review of Systems  Gastrointestinal:  Positive for vomiting.    Physical Exam Updated Vital Signs BP (!) 168/110 (BP Location: Right Arm)   Pulse 90   Temp 98.3 F (36.8 C) (Oral)   Resp (!) 21   LMP 07/22/2023 (Approximate)   SpO2 99%  Physical Exam Vitals and nursing note reviewed.  HENT:     Head: Normocephalic and atraumatic.     Mouth/Throat:     Mouth: Mucous membranes are moist.  Eyes:     General:        Right eye: No discharge.        Left eye: No discharge.     Conjunctiva/sclera: Conjunctivae normal.  Cardiovascular:     Rate and Rhythm: Normal rate and regular rhythm.     Pulses: Normal pulses.     Heart sounds: Normal heart sounds.  Pulmonary:     Effort: Pulmonary effort is normal.     Breath sounds: Normal breath sounds.  Abdominal:     General: Abdomen is flat. There is no distension.     Palpations: Abdomen is soft.     Tenderness: There is no abdominal tenderness.  Skin:    General: Skin is warm and dry.  Neurological:     General: No focal deficit present.  Psychiatric:        Mood and Affect: Mood  normal.     ED Results / Procedures / Treatments   Labs (all labs ordered are listed, but only abnormal results are displayed) Labs Reviewed  COMPREHENSIVE METABOLIC PANEL - Abnormal; Notable for the following components:      Result Value   Sodium 134 (*)    Potassium 3.2 (*)    CO2 19 (*)    Glucose, Bld 128 (*)    All other components within normal limits  CBC WITH DIFFERENTIAL/PLATELET - Abnormal; Notable for the following components:   MCV 100.2 (*)    Lymphs Abs 0.5 (*)    All other components within normal limits  TROPONIN I (HIGH SENSITIVITY) - Abnormal; Notable for the following components:   Troponin I (High Sensitivity) 20 (*)    All other components within normal limits  LIPASE, BLOOD  HCG, QUANTITATIVE, PREGNANCY  D-DIMER, QUANTITATIVE  URINALYSIS, ROUTINE W REFLEX MICROSCOPIC  TROPONIN I (HIGH  SENSITIVITY)    EKG EKG Interpretation Date/Time:  Thursday August 23 2023 20:28:58 EST Ventricular Rate:  90 PR Interval:  122 QRS Duration:  75 QT Interval:  402 QTC Calculation: 492 R Axis:   104  Text Interpretation: Sinus rhythm Non-specific ST-t changes Confirmed by Bernard Drivers ([id number]) on 08/23/2023 10:33:21 PM  Radiology DG Chest Port 1 View Result Date: 08/23/2023 CLINICAL DATA:  Chest pain and weakness EXAM: PORTABLE CHEST 1 VIEW COMPARISON:  11/21/2016 FINDINGS: The heart size and mediastinal contours are within normal limits. Both lungs are clear. The visualized skeletal structures are unremarkable. IMPRESSION: No active disease. Electronically Signed   By: Oneil Devonshire M.D.   On: 08/23/2023 22:01    Procedures Procedures    Medications Ordered in ED Medications  potassium chloride  SA (KLOR-CON  M) CR tablet 40 mEq (has no administration in time range)  sodium chloride  0.9 % bolus 1,000 mL (0 mLs Intravenous Stopped 08/23/23 2226)  droperidol  (INAPSINE ) 2.5 MG/ML injection 1.25 mg (1.25 mg Intravenous Given 08/23/23 2037)    ED Course/ Medical Decision Making/ A&P Clinical Course as of 08/23/23 2306  Thu Aug 23, 2023  2225 DG Chest [place] 1 View [JR]  2301 Troponin I (High Sensitivity)(!): 20 [JR]    Clinical Course User Index [JR] Lang Norleen POUR, PA-C                                 Medical Decision Making Amount and/or Complexity of Data Reviewed Labs: ordered. Radiology: ordered. Decision-making details documented in ED Course.  Risk Prescription drug management.   Initial Impression and Ddx 38 year old well-appearing female presenting for nausea and emesis, generalized abdominal pain and chest pain and shortness of breath.  Exam was unremarkable.  DDx includes pancreatitis, CHS, other intra-abdominal infection, ACS, PE, electrolyte derangement, sepsis, other. 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, alcohol and illicit drug abuse, and marijuana use  Interpretation of Diagnostics - I independent reviewed and interpreted the labs as followed: hypokalemia, first troponin was 14, second troponin was 20  - I independently visualized the following imaging with scope of interpretation limited to determining acute life threatening conditions related to emergency care: CXR, which revealed no acute findings  -I personally reviewed and interpreted EKG which revealed sinus rhythm and nonspecific ST changes but otherwise nonischemic  Patient Reassessment and Ultimate Disposition/Management On reassessment, patient stated that symptoms had improved considerably and  patient stated that chest pain had resolved.  Workup was suggestive of ACS or PE.  Chest pain is also atypical in setting of intense anxiety.  Suspect psychogenic component to her symptoms.  Labs and vitals also did not indicate intra-abdominal infection and patient was nontender on exam.  Suspect symptoms could be related to marijuana use, possibly CHS or could be viral illness.  Fluid challenge without complication. Sent Zofran  to her pharmacy.  Discussed pertinent return precautions.  Vitals remained stable throughout encounter.  Advised follow-up PCP.  Discharged with condition.  Patient management required discussion with the following services or consulting groups:  Hospitalist Service  Complexity of Problems Addressed Acute complicated illness or Injury  Additional Data Reviewed and Analyzed Further history obtained from: Past medical history and medications listed in the EMR and Prior ED visit notes  Patient Encounter Risk Assessment Prescriptions         Final Clinical Impression(s) / ED Diagnoses Final diagnoses:  Atypical chest pain  Nausea and vomiting, unspecified vomiting type    Rx / DC Orders ED Discharge Orders          Ordered    ondansetron   (ZOFRAN ) 4 MG tablet  Every 6 hours        08/23/23 2302              Alishah Schulte K, PA-C 08/23/23 2311    Bernard Drivers, MD 08/27/23 [phone]

## 2023-08-23 NOTE — Telephone Encounter (Signed)
 Pt left VM on triage line with reports of being in yellow zone (TOC appt 1/6)  -while reviewing chart pt is current in the ER at [facility]

## 2023-08-24 ENCOUNTER — Emergency Department (HOSPITAL_COMMUNITY)
Admission: EM | Admit: 2023-08-24 | Discharge: 2023-08-24 | Discharge status: Against Medical Advice | Payer: MEDICAID | Attending: Emergency Medicine | Admitting: Emergency Medicine

## 2023-08-24 ENCOUNTER — Encounter (HOSPITAL_COMMUNITY): Payer: Self-pay

## 2023-08-24 ENCOUNTER — Other Ambulatory Visit: Payer: Self-pay

## 2023-08-24 DIAGNOSIS — Z5321 Procedure and treatment not carried out due to patient leaving prior to being seen by health care provider: Secondary | ICD-10-CM | POA: Diagnosis not present

## 2023-08-24 DIAGNOSIS — R112 Nausea with vomiting, unspecified: Secondary | ICD-10-CM | POA: Insufficient documentation

## 2023-08-24 LAB — COMPREHENSIVE METABOLIC PANEL
ALT: 21 U/L (ref 0–44)
AST: 24 U/L (ref 15–41)
Albumin: 4.6 g/dL (ref 3.5–5.0)
Alkaline Phosphatase: 51 U/L (ref 38–126)
Anion gap: 15 (ref 5–15)
BUN: <5 mg/dL — ABNORMAL LOW (ref 6–20)
CO2: 23 mmol/L (ref 22–32)
Calcium: 10 mg/dL (ref 8.9–10.3)
Chloride: 100 mmol/L (ref 98–111)
Creatinine, Ser: 0.69 mg/dL (ref 0.44–1.00)
GFR, Estimated: >60 mL/min (ref >60)
Glucose, Bld: 114 mg/dL — ABNORMAL HIGH (ref 70–99)
Potassium: 3.1 mmol/L — ABNORMAL LOW (ref 3.5–5.1)
Sodium: 138 mmol/L (ref 135–145)
Total Bilirubin: 0.6 mg/dL (ref 0.0–1.2)
Total Protein: 8.1 g/dL (ref 6.5–8.1)

## 2023-08-24 LAB — CBC
HCT: 43.7 % (ref 36.0–46.0)
Hemoglobin: 14.3 g/dL (ref 12.0–15.0)
MCH: 32.6 pg (ref 26.0–34.0)
MCHC: 32.7 g/dL (ref 30.0–36.0)
MCV: 99.8 fL (ref 80.0–100.0)
Platelets: 313 10*3/uL (ref 150–400)
RBC: 4.38 MIL/uL (ref 3.87–5.11)
RDW: 12.8 % (ref 11.5–15.5)
WBC: 6.3 10*3/uL (ref 4.0–10.5)
nRBC: 0 % (ref 0.0–0.2)

## 2023-08-24 LAB — URINALYSIS, ROUTINE W REFLEX MICROSCOPIC
Bilirubin Urine: NEGATIVE
Glucose, UA: NEGATIVE mg/dL
Hgb urine dipstick: NEGATIVE
Ketones, ur: 20 mg/dL — AB
Leukocytes,Ua: NEGATIVE
Nitrite: NEGATIVE
Protein, ur: NEGATIVE mg/dL
Specific Gravity, Urine: 1.017 (ref 1.005–1.030)
pH: 7 (ref 5.0–8.0)

## 2023-08-24 LAB — LIPASE, BLOOD: Lipase: 31 U/L (ref 11–51)

## 2023-08-24 LAB — HCG, SERUM, QUALITATIVE: Preg, Serum: NEGATIVE

## 2023-08-24 NOTE — ED Triage Notes (Signed)
 Pt reports nausea and vomiting that started this morning at 0800. She has been having these symptoms for over a week now. Was seen her [facility] ED yesterday for the same symptoms. She reports she is taking her Zofran  but didn't realize it was ODT and she has been swallowing them and then throwing them up. Education about ODT Zofran  provided.

## 2023-08-24 NOTE — ED Notes (Signed)
 Pt states that she is leaving, she cannot wait any longer.

## 2023-08-25 ENCOUNTER — Encounter (HOSPITAL_COMMUNITY): Payer: Self-pay

## 2023-08-25 ENCOUNTER — Emergency Department (HOSPITAL_COMMUNITY)
Admission: EM | Admit: 2023-08-25 | Discharge: 2023-08-25 | Disposition: A | Discharge status: Home / Self Care | Payer: MEDICAID | Attending: Emergency Medicine | Admitting: Emergency Medicine

## 2023-08-25 DIAGNOSIS — E876 Hypokalemia: Secondary | ICD-10-CM | POA: Insufficient documentation

## 2023-08-25 DIAGNOSIS — R112 Nausea with vomiting, unspecified: Secondary | ICD-10-CM | POA: Diagnosis present

## 2023-08-25 DIAGNOSIS — D72829 Elevated white blood cell count, unspecified: Secondary | ICD-10-CM | POA: Insufficient documentation

## 2023-08-25 DIAGNOSIS — K2920 Alcoholic gastritis without bleeding: Secondary | ICD-10-CM | POA: Diagnosis not present

## 2023-08-25 LAB — URINALYSIS, ROUTINE W REFLEX MICROSCOPIC
Bacteria, UA: NONE SEEN
Bilirubin Urine: NEGATIVE
Glucose, UA: NEGATIVE mg/dL
Hgb urine dipstick: NEGATIVE
Ketones, ur: 20 mg/dL — AB
Leukocytes,Ua: NEGATIVE
Nitrite: NEGATIVE
Protein, ur: 30 mg/dL — AB
Specific Gravity, Urine: 1.021 (ref 1.005–1.030)
pH: 6 (ref 5.0–8.0)

## 2023-08-25 LAB — COMPREHENSIVE METABOLIC PANEL
ALT: 22 U/L (ref 0–44)
AST: 28 U/L (ref 15–41)
Albumin: 4.7 g/dL (ref 3.5–5.0)
Alkaline Phosphatase: 49 U/L (ref 38–126)
Anion gap: 14 (ref 5–15)
BUN: 8 mg/dL (ref 6–20)
CO2: 23 mmol/L (ref 22–32)
Calcium: 9.7 mg/dL (ref 8.9–10.3)
Chloride: 99 mmol/L (ref 98–111)
Creatinine, Ser: 0.47 mg/dL (ref 0.44–1.00)
GFR, Estimated: >60 mL/min (ref >60)
Glucose, Bld: 112 mg/dL — ABNORMAL HIGH (ref 70–99)
Potassium: 3.3 mmol/L — ABNORMAL LOW (ref 3.5–5.1)
Sodium: 136 mmol/L (ref 135–145)
Total Bilirubin: 0.9 mg/dL (ref 0.0–1.2)
Total Protein: 8.5 g/dL — ABNORMAL HIGH (ref 6.5–8.1)

## 2023-08-25 LAB — CBC
HCT: 47.8 % — ABNORMAL HIGH (ref 36.0–46.0)
Hemoglobin: 15.3 g/dL — ABNORMAL HIGH (ref 12.0–15.0)
MCH: 32.7 pg (ref 26.0–34.0)
MCHC: 32 g/dL (ref 30.0–36.0)
MCV: 102.1 fL — ABNORMAL HIGH (ref 80.0–100.0)
Platelets: 330 10*3/uL (ref 150–400)
RBC: 4.68 MIL/uL (ref 3.87–5.11)
RDW: 13.1 % (ref 11.5–15.5)
WBC: 12 10*3/uL — ABNORMAL HIGH (ref 4.0–10.5)
nRBC: 0 % (ref 0.0–0.2)

## 2023-08-25 LAB — LIPASE, BLOOD: Lipase: 31 U/L (ref 11–51)

## 2023-08-25 MED ORDER — LIDOCAINE VISCOUS HCL 2 % MT SOLN
15.0000 mL | Freq: Once | OROMUCOSAL | Status: AC
Start: 2023-08-25 — End: 2023-08-25
  Administered 2023-08-25: 15 mL via ORAL
  Filled 2023-08-25: qty 15

## 2023-08-25 MED ORDER — METOCLOPRAMIDE HCL 5 MG/ML IJ SOLN
10.0000 mg | Freq: Once | INTRAMUSCULAR | Status: AC
  Administered 2023-08-25: 10 mg via INTRAVENOUS
  Filled 2023-08-25: qty 2

## 2023-08-25 MED ORDER — SODIUM CHLORIDE 0.9 % IV BOLUS
1000.0000 mL | Freq: Once | INTRAVENOUS | Status: AC
  Administered 2023-08-25: 1000 mL via INTRAVENOUS

## 2023-08-25 MED ORDER — PANTOPRAZOLE SODIUM 20 MG PO TBEC
20.0000 mg | DELAYED_RELEASE_TABLET | Freq: Every day | ORAL | 3 refills | Status: DC
  Filled 2023-08-25: qty 30, 30d supply, fill #0

## 2023-08-25 MED ORDER — POTASSIUM CHLORIDE CRYS ER 20 MEQ PO TBCR
40.0000 meq | EXTENDED_RELEASE_TABLET | Freq: Once | ORAL | Status: AC
  Administered 2023-08-25: 40 meq via ORAL
  Filled 2023-08-25: qty 2

## 2023-08-25 MED ORDER — ONDANSETRON 4 MG PO TBDP: 4.0000 mg | ORAL_TABLET | Freq: Three times a day (TID) | ORAL | 0 refills | Status: DC | PRN

## 2023-08-25 MED ORDER — SUCRALFATE 1 G PO TABS
1.0000 g | ORAL_TABLET | Freq: Three times a day (TID) | ORAL | 0 refills | Status: DC
  Filled 2023-08-25: qty 40, 10d supply, fill #0

## 2023-08-25 MED ORDER — ALUM & MAG HYDROXIDE-SIMETH 200-200-20 MG/5ML PO SUSP
30.0000 mL | Freq: Once | ORAL | Status: AC
  Administered 2023-08-25: 30 mL via ORAL
  Filled 2023-08-25: qty 30

## 2023-08-25 MED ORDER — ONDANSETRON 4 MG PO TBDP
4.0000 mg | ORAL_TABLET | Freq: Three times a day (TID) | ORAL | 0 refills | Status: AC | PRN
  Filled 2023-08-25: qty 20, 7d supply, fill #0

## 2023-08-25 NOTE — ED Provider Triage Note (Signed)
 Emergency Medicine Provider Triage Evaluation Note  [name] , a 38 y.o. female  was evaluated in triage.  Pt complains of N, V.  Review of Systems  Positive: Persistent sxs despite meds Negative: Fever, hematemesis, ETOH use  Physical Exam  BP (!) 163/110 (BP Location: Left Arm)   Pulse 88   Temp 98.1 F (36.7 C) (Oral)   Resp 20   LMP 08/20/2023 (Approximate)   SpO2 99%  Gen:   Awake,  Resp:  Normal effort  MSK:   Moves extremities without difficulty  Other:    Medical Decision Making  Medically screening exam initiated at 10:47 AM.  Appropriate orders placed.  [name] was informed that the remainder of the evaluation will be completed by another provider, this initial triage assessment does not replace that evaluation, and the importance of remaining in the ED until their evaluation is complete.  Persistent vomiting for the past 2 weeks. Quit drinking 2 weeks ago, no relapse. Has new diagnosis recently of heart failure and has outpatient f/u for this. Zofran  at home for nausea without control of symptoms.    Lisa Balls, PA-C 08/25/23 1049

## 2023-08-25 NOTE — ED Provider Notes (Signed)
 Patient given in sign out by Warren Shad, PA-C.  Please review their note for patient HPI, physical exam, workup.  At this time the plan is p.o. challenge and if successful will discharge home.  I went to go evaluate the patient patient is drinking water comfortably and states that she has been able to keep water down since being given the medication.  Patient states that she would like to be discharged as she feels well at this time and feels improved with medications.  Patient does state that her symptoms are worse when lying down or when she wakes up in the morning so I do feel this is related to her gastritis we will give her sucralfate  along with represcribing her Protonix  that she has not yet picked up.  Will also prescribe the Zofran  as per the previous provider.  Patient be discharged and she has successfully passed p.o. challenge.  Patient stable to be discharged at this time.  Encouraged her to follow-up with her primary care provider.  Patient verbalized understanding acceptance of this plan.  Patient given return precautions.    Victor Lynwood ONEIDA DEVONNA 08/25/23 1632    Dean Clarity, MD 08/26/23 [phone]

## 2023-08-25 NOTE — ED Provider Notes (Signed)
 [place] EMERGENCY DEPARTMENT AT [facility] Provider Note   CSN: [id number] Arrival date & time: 08/25/23  9048     History  Chief Complaint  Patient presents with   Emesis   Nausea    [name] is a 38 y.o. female with history of alcohol withdrawal, hypokalemia, intractable nausea vomiting, NSTEMI presenting to emergency room with complaint of nausea and vomiting for several days.  Patient also reports generalized cramping abdominal pain and soreness, she reports she feels sore from vomiting. She tried zofran  at home which didn't help, but she reports she does not think she is keeping the medicine down. Reports history of EtOH use, but denying's drinking or smoking for the past 2 weeks. Denies change in symptoms since previous visit 2 days ago. Reports she has just been throwing up water, denies blood or bilious material. Denies recent travel. Denies chest pain, shortness of breath, diarrhea, sick contacts, fever or chills.    Emesis      Home Medications Prior to Admission medications   Medication Sig Start Date End Date Taking? Authorizing Provider  famotidine  (PEPCID ) 40 MG tablet Take 1 tablet (40 mg total) by mouth daily. 11/21/22   Francesca Elsie CROME, MD  levonorgestrel (MIRENA) 20 MCG/24HR IUD 1 each by Intrauterine route once.    [provider]  levonorgestrel (MIRENA) 20 MCG/DAY IUD 1 each by Intrauterine route once.    [provider]  metoprolol  succinate (TOPROL -XL) 50 MG 24 hr tablet Take 1 tablet (50 mg total) by mouth daily. Take with or immediately following a meal. 08/22/23   Danford, Lonni SQUIBB, MD  ondansetron  (ZOFRAN ) 4 MG tablet Take 1 tablet (4 mg total) by mouth every 6 (six) hours. 08/23/23   Robinson, John K, PA-C  pantoprazole  (PROTONIX ) 20 MG tablet Take 1 tablet (20 mg total) by mouth daily. 08/21/23   Danford, Lonni SQUIBB, MD  sacubitril -valsartan  (ENTRESTO ) 24-26 MG Take 1 tablet by mouth 2 (two) times daily. 08/22/23    Danford, Lonni SQUIBB, MD      Allergies    Morphine  and codeine    Review of Systems   Review of Systems  Gastrointestinal:  Positive for nausea and vomiting.    Physical Exam Updated Vital Signs BP (!) 163/110 (BP Location: Left Arm)   Pulse 88   Temp 98.1 F (36.7 C) (Oral)   Resp 20   LMP 08/20/2023 (Approximate)   SpO2 99%  Physical Exam Vitals and nursing note reviewed.  Constitutional:      General: She is not in acute distress.    Appearance: She is not toxic-appearing.  HENT:     Head: Normocephalic and atraumatic.  Eyes:     General: No scleral icterus.    Conjunctiva/sclera: Conjunctivae normal.  Cardiovascular:     Rate and Rhythm: Normal rate and regular rhythm.     Pulses: Normal pulses.     Heart sounds: Normal heart sounds.  Pulmonary:     Effort: Pulmonary effort is normal. No respiratory distress.     Breath sounds: Normal breath sounds.  Abdominal:     General: Abdomen is flat. Bowel sounds are normal. There is no distension.     Palpations: Abdomen is soft. There is no mass.     Tenderness: There is no abdominal tenderness.  Musculoskeletal:     Right lower leg: No edema.     Left lower leg: No edema.  Skin:    General: Skin is warm and dry.  Findings: No lesion.  Neurological:     General: No focal deficit present.     Mental Status: She is alert and oriented to person, place, and time. Mental status is at baseline.     ED Results / Procedures / Treatments   Labs (all labs ordered are listed, but only abnormal results are displayed) Labs Reviewed  COMPREHENSIVE METABOLIC PANEL - Abnormal; Notable for the following components:      Result Value   Potassium 3.3 (*)    Glucose, Bld 112 (*)    Total Protein 8.5 (*)    All other components within normal limits  CBC - Abnormal; Notable for the following components:   WBC 12.0 (*)    Hemoglobin 15.3 (*)    HCT 47.8 (*)    MCV 102.1 (*)    All other components within normal limits   URINALYSIS, ROUTINE W REFLEX MICROSCOPIC - Abnormal; Notable for the following components:   APPearance HAZY (*)    Ketones, ur 20 (*)    Protein, ur 30 (*)    All other components within normal limits  LIPASE, BLOOD    EKG None  Radiology DG Chest Port 1 View Result Date: 08/23/2023 CLINICAL DATA:  Chest pain and weakness EXAM: PORTABLE CHEST 1 VIEW COMPARISON:  11/21/2016 FINDINGS: The heart size and mediastinal contours are within normal limits. Both lungs are clear. The visualized skeletal structures are unremarkable. IMPRESSION: No active disease. Electronically Signed   By: Oneil Devonshire M.D.   On: 08/23/2023 22:01    Procedures Procedures    Medications Ordered in ED Medications  sodium chloride  0.9 % bolus 1,000 mL (0 mLs Intravenous Stopped 08/25/23 1652)  metoCLOPramide  (REGLAN ) injection 10 mg (10 mg Intravenous Given 08/25/23 1419)  potassium chloride  SA (KLOR-CON  M) CR tablet 40 mEq (40 mEq Oral Given 08/25/23 1546)  alum & mag hydroxide-simeth (MAALOX/MYLANTA) 200-200-20 MG/5ML suspension 30 mL (30 mLs Oral Given 08/25/23 1649)    And  lidocaine  (XYLOCAINE ) 2 % viscous mouth solution 15 mL (15 mLs Oral Given 08/25/23 1649)    ED Course/ Medical Decision Making/ A&P                                 Medical Decision Making Amount and/or Complexity of Data Reviewed Labs: ordered.  Risk OTC drugs. Prescription drug management.   [name] 38 y.o. presented today for abd pain. Working DDx includes, but not limited to, gastroenteritis, colitis, SBO, appendicitis, cholecystitis, hepatobiliary pathology, gastritis, PUD, ACS, dissection, pancreatitis, nephrolithiasis, AAA, UTI, pyelonephritis, torsion.   R/o DDx: These are considered less likely than current impression due to history of present illness, physical exam, labs/imaging findings.  Review of prior external notes: 08/23/23 in which patient was seen for similar thing. At that time D-dimer negative, and troponin  flat.   Pmhx: alcohol withdrawal, hypokalemia, intractable nausea vomiting, NSTEMI   Unique Tests and My Interpretation:  CBC with differential: leukocytosis of 12, no anemia  CMP: potassium 3.3 - will supplement with PO, no anion gap Lipase: 31 UA: mild ketones, negative for UTI  EKG: Rate, rhythm, axis, intervals all examined: normal sinus with no significant change in x2 days   Imaging:  Patient without focal abdominal pain, I had not ordered imaging at this time.   Problem List / ED Course / Critical interventions / Medication management  Patient reports she has had several days of nausea and vomiting with  history of alcohol gastritis, this has been ongoing for 2 weeks, symptoms have been intermittent. Was seen two days ago for similar thing, but today denying chest pain. Reports symptoms have not changes since recently seen. Does have low potassium of 3.3 and mild leukocytosis. No fever, no tachycardia. No infectious symptoms. No focal tenderness of abdominal exam. Offered pain medication, patient declined.  I ordered medication including Reglan , NS, potassium and GI cocktail.  Reevaluation of the patient after these medicines showed that the patient improved, however will wait 15-20 minutes before PO challenging.  Patients vitals assessed. Upon arrival patient is hemodynamically stable.  I have reviewed the patients home medicines and have made adjustments as needed    Consult: none   Plan:  Signed off, pending PO challenge and re-eval.          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

## 2023-08-25 NOTE — Discharge Instructions (Addendum)
 I have sent zofran , which dissolve under your tongue, so you do have to swallow the medicine if you are vomiting, for the medication to be effective.  Please pick up the other medications I have sent to your pharmacy as well follow-up with your primary care provider.  If symptoms change or worsen please return to the ER.  In the meantime please avoid alcohol along with large fatty meals as this will make your symptoms worse.

## 2023-08-25 NOTE — ED Triage Notes (Signed)
 PT arrives via EMS from home. Pt reports ongoing nausea and vomiting. Pt recently seen for the same. Pt AxOx4.

## 2023-08-26 ENCOUNTER — Other Ambulatory Visit (HOSPITAL_COMMUNITY): Payer: Self-pay

## 2023-08-26 ENCOUNTER — Telehealth (HOSPITAL_COMMUNITY): Payer: Self-pay | Admitting: Emergency Medicine

## 2023-08-26 MED ORDER — ONDANSETRON HCL 4 MG PO TABS: 4.0000 mg | ORAL_TABLET | Freq: Four times a day (QID) | ORAL | 0 refills | Status: AC

## 2023-08-26 MED ORDER — SUCRALFATE 1 G PO TABS: 1.0000 g | ORAL_TABLET | Freq: Three times a day (TID) | ORAL | 0 refills | Status: AC

## 2023-08-26 MED ORDER — PANTOPRAZOLE SODIUM 20 MG PO TBEC: 20.0000 mg | DELAYED_RELEASE_TABLET | Freq: Every day | ORAL | 3 refills | Status: AC

## 2023-08-26 NOTE — Telephone Encounter (Signed)
Needs medication sent to a different pharmacy

## 2023-08-27 ENCOUNTER — Other Ambulatory Visit (HOSPITAL_COMMUNITY): Payer: Self-pay

## 2023-08-27 ENCOUNTER — Other Ambulatory Visit: Payer: Self-pay

## 2023-08-27 ENCOUNTER — Encounter (HOSPITAL_COMMUNITY): Payer: MEDICAID

## 2023-08-30 NOTE — Progress Notes (Deleted)
 Cardiology Office Note:    Date:  09/03/2023  ID:  [name], DOB May 13, 1986, MRN [id number] PCP: Patient, No Pcp Per  [facility] Providers Cardiologist:  None { Click to update primary MD,subspecialty MD or APP then REFRESH:1}    {Click to Open Review  :1}   Patient Profile:      [name] has a PMH of cardiomyopathy, alcoholic gastritis, nausea and vomiting, GERD, alcohol withdrawal, hypokalemia, hypocalcemia, drug abuse, NSTEMI, and hyponatremia.   She was admitted 08/16/2023 and cardiology service consulted for abnormal echo. She presented after heavy drinking and had been noted to have increasing shortness of breath with exertion and fatigue, but has denied any exertional chest pain. She presented to the emergency department with nausea and vomiting thought due to marijuana and alcohol use. She underwent echocardiogram on 08/17/2023 showing LVEF 45-50%, akinesis of the basal inferior and inferoseptal walls, RV SF normal, mild mitral valve regurgitation. Underwent cardiac catheterization on 08/20/2023 and was noted to not have coronary disease.  No further ischemic workup was recommended in setting of nonischemic cardiomyopathy likely due to alcohol abuse. She was started on Toprol -XL and Entresto .   She was seen in the ED on 08/23/2023.  She reported abdominal pain.  Her symptoms started that morning.  She denied fever.  She states she had chest pain and shortness of breath that had also started that morning.  She noted that her chest pain was all over her chest.  It was otherwise nonradiating, nonexertional, and nonpleuritic in nature.  She reported that she was extremely anxious.  She denied use of THC products for 2 days.  She denied recent alcohol use.  Her blood pressure was noted to be 168/110.  Her pulse was 90.  Her EKG showed sinus rhythm with nonspecific ST and T wave changes 90 bpm.  Her exam was unremarkable.  Her recent NSTEMI was in the setting of alcoholic gastritis,  cardiomyopathy was suspected from alcoholic induced, alcohol and illicit drug use and marijuana use.  Her lab work showed hypokalemia.  Her first cardiac troponin was 14 and her second was 20.  Her chest x-ray showed no acute findings.  It was felt that her EKG was nonischemic.  On reassessment her symptoms had improved.  Her chest pain resolved.  Her prior chest pain was atypical.  It was noted to be in the setting of intense anxiety.  It was felt that there was a psychogenic component to her symptoms.  It was felt that her symptoms were related to marijuana use, CHS, or possible viral illness.  She was prescribed Zofran .  She was instructed to follow-up with her PCP.  She presents to the clinic today for follow-up evaluation and states***.  *** denies chest pain, shortness of breath, lower extremity edema, fatigue, palpitations, melena, hematuria, hemoptysis, diaphoresis, weakness, presyncope, syncope, orthopnea, and PND.  Atypical chest pain-no chest pain today.  Denies recent episodes of arm neck back or chest discomfort.  No chest discomfort since being discharged from the hospital.  Nonischemic cardiomyopathy-has returned to normal activities.  Echocardiogram 08/17/2023 showed LVEF of 45-50%, mild mitral valve regurgitation and no other significant valvular abnormalities.  Cardiomyopathy in the setting of alcoholic gastritis during the end of December admission.  High-sensitivity troponins were [id number].  There was concern for NSTEMI.  Cardiac catheterization showed no coronary disease. Continue metoprolol , Entresto  Heart healthy low-sodium diet  Sinus tachycardia-heart rate today***. Continue metoprolol  EtOH cessation  EtOH abuse-reports no EtOH or recreational drug use.  Congratulated on cessation.  Disposition: Follow-up with Dr. Acharya or me in 4-6 months.      History of Present Illness:  Discussed the use of AI scribe software for clinical note transcription with the patient,  who gave verbal consent to proceed.  [name] is a 38 y.o. female who returns for ***Discussed the use of AI scribe software for clinical note transcription with the patient, who gave verbal consent to proceed.  History of Present Illness            ROS   See HPI ***    Studies Reviewed:       *** Risk Assessment/Calculations:   {Does this patient have ATRIAL FIBRILLATION?:[id number]} No BP recorded.  {Refresh Note OR Click here to enter BP  :1}***       Physical Exam:   VS:  LMP 08/20/2023 (Approximate)    Wt Readings from Last 3 Encounters:  08/24/23 138 lb (62.6 kg)  08/17/23 138 lb 0.1 oz (62.6 kg)  08/14/23 140 lb (63.5 kg)    Physical Exam***     Assessment and Plan:  Assessment and Plan              {Are you ordering a CV Procedure (e.g. stress test, cath, DCCV, TEE, etc)?   Press F2        :[id number]}  Dispo:  No follow-ups on file.  Signed, Lum LITTIE Louis, NP

## 2023-09-03 ENCOUNTER — Ambulatory Visit: Payer: MEDICAID | Attending: General Practice | Admitting: General Practice

## 2023-09-05 ENCOUNTER — Other Ambulatory Visit (HOSPITAL_COMMUNITY): Payer: Self-pay

## 2023-11-30 ENCOUNTER — Emergency Department (HOSPITAL_BASED_OUTPATIENT_CLINIC_OR_DEPARTMENT_OTHER): Payer: MEDICAID

## 2023-11-30 ENCOUNTER — Other Ambulatory Visit (HOSPITAL_BASED_OUTPATIENT_CLINIC_OR_DEPARTMENT_OTHER): Payer: Self-pay

## 2023-11-30 ENCOUNTER — Emergency Department (HOSPITAL_BASED_OUTPATIENT_CLINIC_OR_DEPARTMENT_OTHER)
Admission: EM | Admit: 2023-11-30 | Discharge: 2023-11-30 | Disposition: A | Discharge status: Home / Self Care | Payer: MEDICAID | Attending: Emergency Medicine | Admitting: Emergency Medicine

## 2023-11-30 ENCOUNTER — Encounter (HOSPITAL_BASED_OUTPATIENT_CLINIC_OR_DEPARTMENT_OTHER): Payer: Self-pay | Admitting: Emergency Medicine

## 2023-11-30 ENCOUNTER — Other Ambulatory Visit: Payer: Self-pay

## 2023-11-30 DIAGNOSIS — Z2914 Encounter for prophylactic rabies immune globin: Secondary | ICD-10-CM | POA: Diagnosis not present

## 2023-11-30 DIAGNOSIS — Z23 Encounter for immunization: Secondary | ICD-10-CM | POA: Diagnosis not present

## 2023-11-30 DIAGNOSIS — Y92521 Bus station as the place of occurrence of the external cause: Secondary | ICD-10-CM | POA: Insufficient documentation

## 2023-11-30 DIAGNOSIS — S01511A Laceration without foreign body of lip, initial encounter: Secondary | ICD-10-CM | POA: Insufficient documentation

## 2023-11-30 DIAGNOSIS — S61431A Puncture wound without foreign body of right hand, initial encounter: Secondary | ICD-10-CM | POA: Insufficient documentation

## 2023-11-30 DIAGNOSIS — W540XXA Bitten by dog, initial encounter: Secondary | ICD-10-CM | POA: Insufficient documentation

## 2023-11-30 DIAGNOSIS — S0185XA Open bite of other part of head, initial encounter: Secondary | ICD-10-CM

## 2023-11-30 MED ORDER — RABIES IMMUNE GLOBULIN 150 UNIT/ML IM INJ
20.0000 [IU]/kg | INJECTION | Freq: Once | INTRAMUSCULAR | Status: AC
Start: 2023-11-30 — End: 2023-11-30
  Administered 2023-11-30: 1200 [IU]
  Filled 2023-11-30: qty 8
  Filled 2023-11-30: qty 2

## 2023-11-30 MED ORDER — TETANUS-DIPHTH-ACELL PERTUSSIS 5-2.5-18.5 LF-MCG/0.5 IM SUSY
0.5000 mL | PREFILLED_SYRINGE | Freq: Once | INTRAMUSCULAR | Status: AC
  Administered 2023-11-30: 0.5 mL via INTRAMUSCULAR
  Filled 2023-11-30: qty 0.5

## 2023-11-30 MED ORDER — AMOXICILLIN-POT CLAVULANATE 875-125 MG PO TABS
1.0000 | ORAL_TABLET | Freq: Two times a day (BID) | ORAL | 0 refills | Status: AC
Start: 2023-11-30 — End: ?
  Filled 2023-11-30: qty 14, 7d supply, fill #0

## 2023-11-30 MED ORDER — RABIES VACCINE, PCEC IM SUSR
1.0000 mL | Freq: Once | INTRAMUSCULAR | Status: AC
  Administered 2023-11-30: 1 mL via INTRAMUSCULAR
  Filled 2023-11-30: qty 1

## 2023-11-30 NOTE — ED Provider Notes (Signed)
 [facility] Provider Note   CSN: [id number] Arrival date & time: 11/30/23  4098     History  Chief Complaint  Patient presents with   Animal Bite    [name] is a 38 y.o. female.  Patient is a healthy 38 year old female presenting today after a dog bite that occurred late last night.  She reports that she was sitting at [place] bus stop and she had been drinking a little bit and she was loud and she was on the phone and she stomped and she thought that that most likely provoked the dog and the dog started attacking her.  It bit her on the left side of her face and her right hand but she reports that a man stepped in and got the dog off of her.  This is not a neighborhood that she lives in and the dog ran off and she has no idea how to find the dog.  Nobody knew who his dog it was and unclear if the dog's been vaccinated.  She is having pain and swelling over her left face as well as her right hand and right middle finger.  She is unclear when her last tetanus shot was.  She thinks it has been more than 10 years.  The history is provided by the patient.  Animal Bite      Home Medications Prior to Admission medications   Medication Sig Start Date End Date Taking? Authorizing Provider  amoxicillin-clavulanate (AUGMENTIN) 875-125 MG tablet Take 1 tablet by mouth every 12 (twelve) hours. 11/30/23  Yes Gwyneth Sprout, MD  famotidine (PEPCID) 40 MG tablet Take 1 tablet (40 mg total) by mouth daily. 11/21/22   Lonell Grandchild, MD  levonorgestrel (MIRENA) 20 MCG/24HR IUD 1 each by Intrauterine route once.    [provider]  levonorgestrel (MIRENA) 20 MCG/DAY IUD 1 each by Intrauterine route once.    [provider]  metoprolol succinate (TOPROL-XL) 50 MG 24 hr tablet Take 1 tablet (50 mg total) by mouth daily. Take with or immediately following a meal. 08/22/23   Danford, Earl Lites, MD  ondansetron (ZOFRAN) 4 MG tablet Take  1 tablet (4 mg total) by mouth every 6 (six) hours. 08/26/23   Terrilee Files, MD  ondansetron (ZOFRAN-ODT) 4 MG disintegrating tablet Take 1 tablet (4 mg total) by mouth every 8 (eight) hours as needed for nausea or vomiting. 08/25/23   Netta Corrigan, PA-C  pantoprazole (PROTONIX) 20 MG tablet Take 1 tablet (20 mg total) by mouth daily. 08/26/23   Terrilee Files, MD  sacubitril-valsartan (ENTRESTO) 24-26 MG Take 1 tablet by mouth 2 (two) times daily. 08/22/23   Danford, Earl Lites, MD  sucralfate (CARAFATE) 1 g tablet Take 1 tablet (1 g total) by mouth 4 (four) times daily -  with meals and at bedtime for 10 days. 08/26/23 09/05/23  Terrilee Files, MD      Allergies    Morphine and codeine    Review of Systems   Review of Systems  Physical Exam Updated Vital Signs BP (!) 137/95   Pulse 77   Temp 97.9 F (36.6 C) (Oral)   Resp 18   Wt 61.2 kg   LMP 11/22/2023 (Approximate)   SpO2 98%   BMI 21.14 kg/m  Physical Exam Vitals and nursing note reviewed.  Constitutional:      General: She is not in acute distress.    Appearance: She is well-developed.  HENT:     Head: Normocephalic. Abrasion and contusion present.     Comments: Patient has swelling and abrasions noted around the left eye cheek and left side of the nose.    Mouth/Throat:   Eyes:     Pupils: Pupils are equal, round, and reactive to light.  Cardiovascular:     Rate and Rhythm: Normal rate and regular rhythm.     Heart sounds: Normal heart sounds. No murmur heard.    No friction rub.  Pulmonary:     Effort: Pulmonary effort is normal.     Breath sounds: Normal breath sounds. No wheezing or rales.  Abdominal:     General: Bowel sounds are normal. There is no distension.     Palpations: Abdomen is soft.     Tenderness: There is no abdominal tenderness. There is no guarding or rebound.  Musculoskeletal:        General: Normal range of motion.     Right hand: Swelling and tenderness present. Normal range of  motion.     Comments: No edema.  Multiple puncture wounds and abrasions on the right hand with generalized swelling and ecchymosis especially of the right middle finger  Skin:    General: Skin is warm and dry.     Findings: No rash.  Neurological:     Mental Status: She is alert and oriented to person, place, and time.     Cranial Nerves: No cranial nerve deficit.  Psychiatric:        Behavior: Behavior normal.     ED Results / Procedures / Treatments   Labs (all labs ordered are listed, but only abnormal results are displayed) Labs Reviewed - No data to display  EKG None  Radiology DG Hand Complete Right Result Date: 11/30/2023 CLINICAL DATA:  Attacked by dog with right hand injury. EXAM: RIGHT HAND - COMPLETE 3 VIEW COMPARISON:  None Available. FINDINGS: Soft tissue swelling especially to the middle finger and dorsal hand. No opaque foreign body or fracture. IMPRESSION: Soft tissue swelling without fracture or opaque foreign body. Electronically Signed   By: Tiburcio Pea M.D.   On: 11/30/2023 10:43    Procedures Procedures    Medications Ordered in ED Medications  Tdap (BOOSTRIX) injection 0.5 mL (0.5 mLs Intramuscular Given 11/30/23 1104)  rabies immune globulin (HYPERRAB/KEDRAB) injection 1,200 Units (1,200 Units Infiltration Given 11/30/23 1108)  rabies vaccine (RABAVERT) injection 1 mL (1 mL Intramuscular Given 11/30/23 1106)    ED Course/ Medical Decision Making/ A&P                                 Medical Decision Making Amount and/or Complexity of Data Reviewed Radiology: ordered and independent interpretation performed. Decision-making details documented in ED Course.  Risk Prescription drug management.   Patient presenting today after being attacked by a dog last night.  Patient has multiple puncture wounds and abrasions and mild laceration to the lip however it occurred more than 12 hours ago and wounds have already scabbed over.  Does not need closure at  this time.  Also patient will need tetanus update as well as rabies vaccine as the dog cannot be found and it was in a neighborhood that she does not live.  Patient will be started on Augmentin.  I have independently visualized and interpreted pt's images today.  Hand imaging is negative for fracture.  Will place in a splint to help immobilize  to help decrease risk of infection.         Final Clinical Impression(s) / ED Diagnoses Final diagnoses:  Dog bite of face, initial encounter  Need for rabies vaccination    Rx / DC Orders ED Discharge Orders          Ordered    amoxicillin-clavulanate (AUGMENTIN) 875-125 MG tablet  Every 12 hours        11/30/23 1056              Gwyneth Sprout, MD 11/30/23 1551

## 2023-11-30 NOTE — Discharge Instructions (Addendum)
 You will take the antibiotic twice a day for the next week.  You can apply ice to the swollen areas.  Wear the brace on your wrist and finger until some of the swelling goes down.  If you start getting a lot of redness, drainage, fever return to the emergency room

## 2023-11-30 NOTE — ED Triage Notes (Signed)
 Reports was attacked by street dog , left face abd eye injury , swelling , redness , right hand injury teeth marks , swelling and bruising .

## 2024-04-20 IMAGING — CT CT MAXILLOFACIAL W/O CM
3 of 4 series · 15 of 47 positions shown, 18 images · non-contrast
Comparison: Face CT 05/24/2018

CLINICAL DATA: Blunt facial trauma.  Bruising under both eyes.



[Series 4: soft tissue thins · axial · 0.39mm/px · z∈[+1223,+1364]mm · 9 of 289 slices shown, 12 images]
[im 27/289  brain]
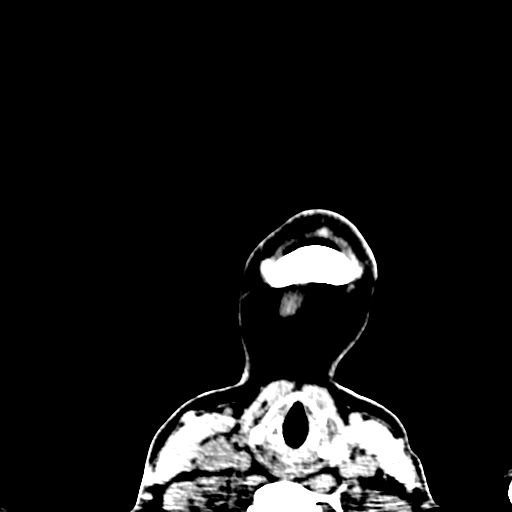
[im 27/289  bone]
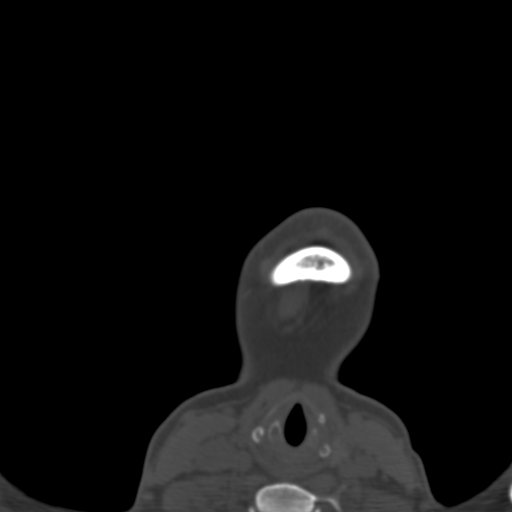
[im 53/289  bone]
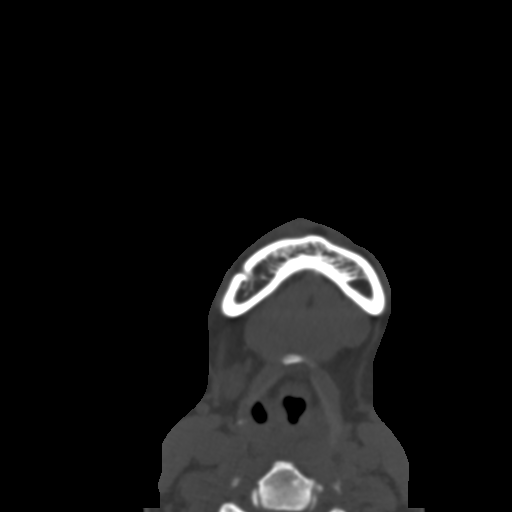
[im 79/289  bone]
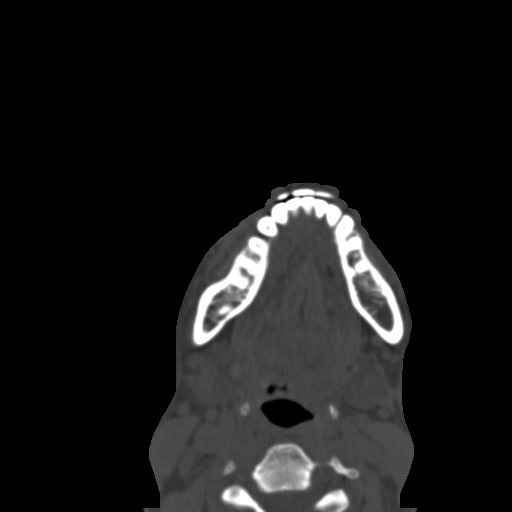
[im 118/289  bone]
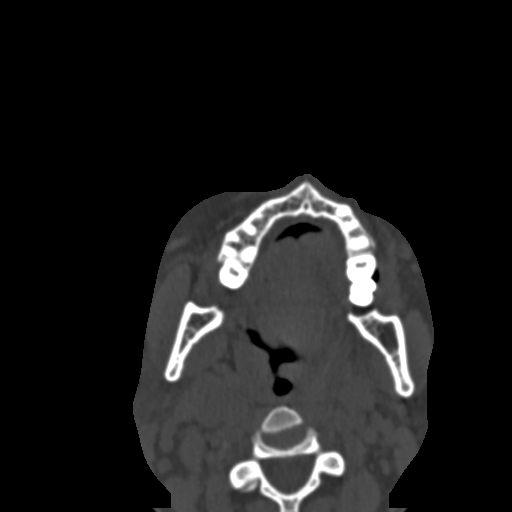
[im 145/289  brain]
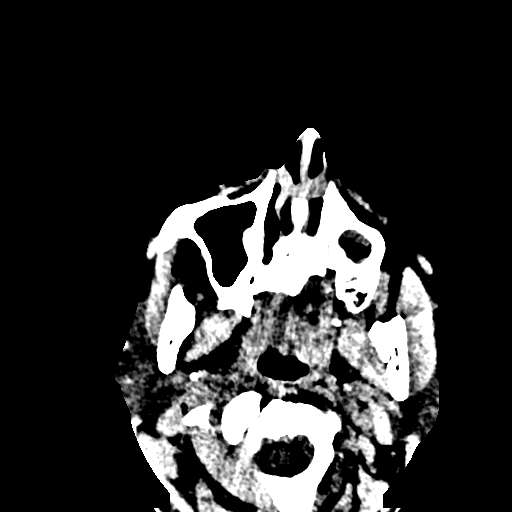
[im 145/289  bone]
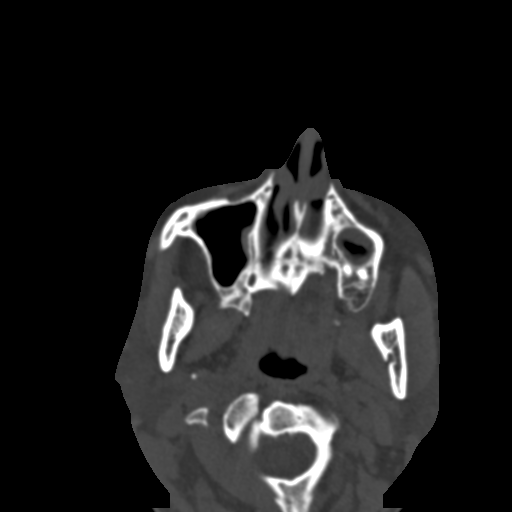
[im 171/289  bone]
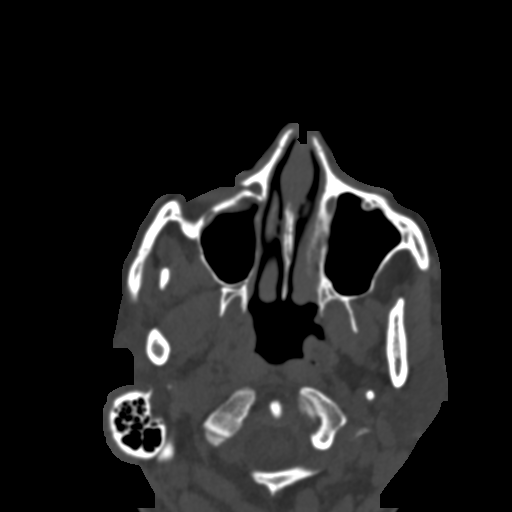
[im 210/289  bone]
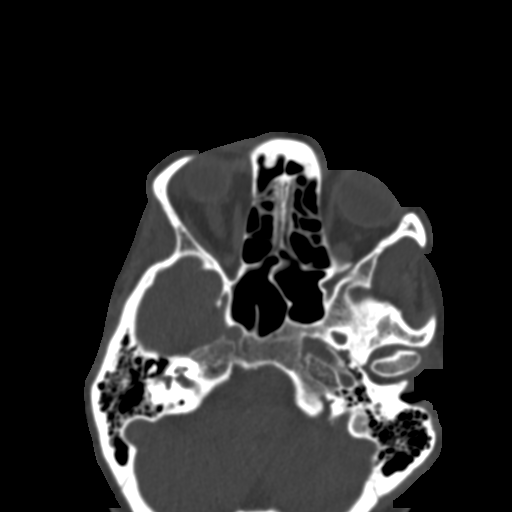
[im 236/289  bone]
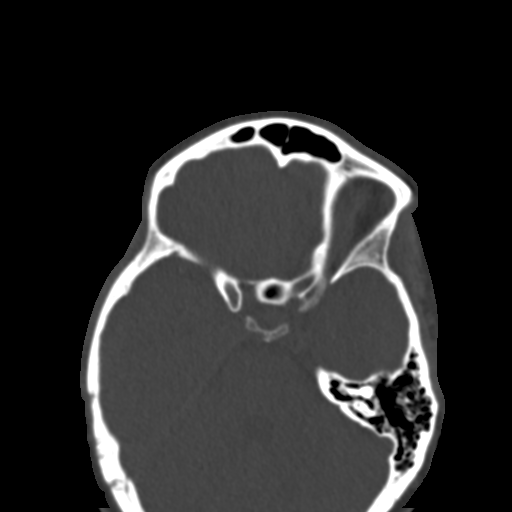
[im 262/289  brain]
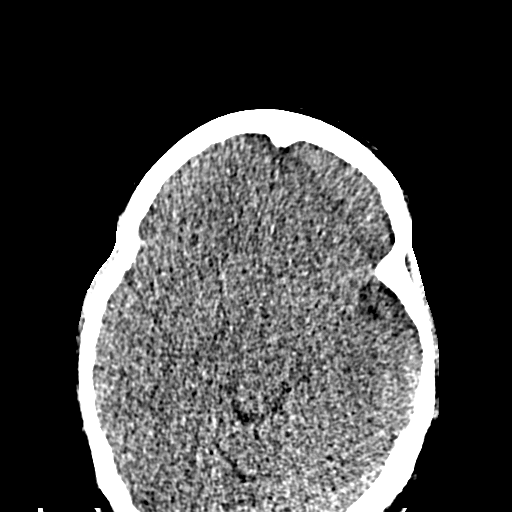
[im 262/289  bone]
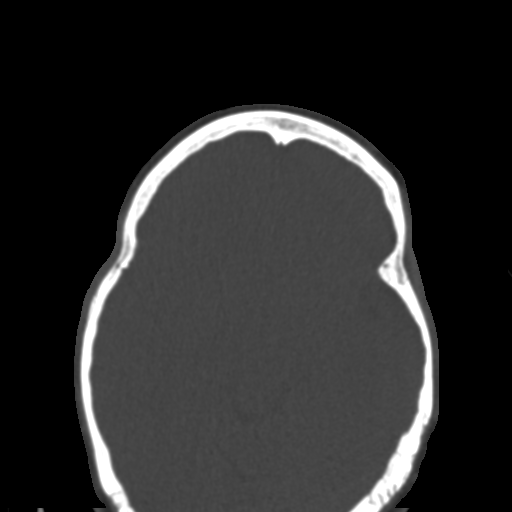

[Series 6: coronal soft · coronal · 0.34mm/px · 3 of 78 slices shown]
[im 26/78  bone]
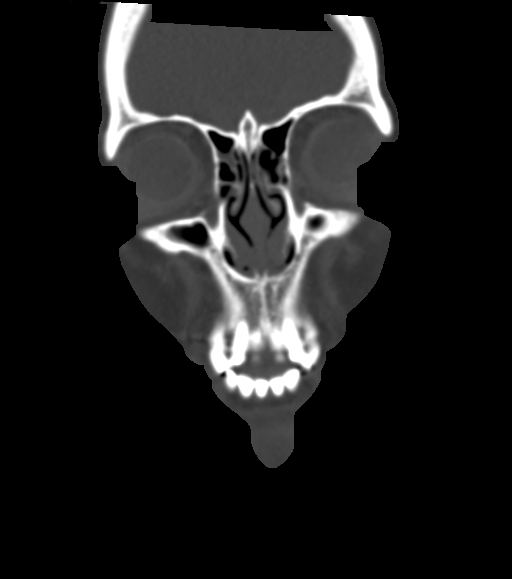
[im 35/78  bone]
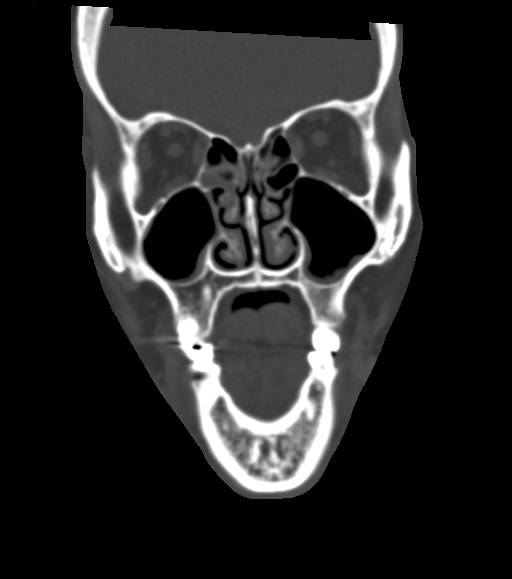
[im 43/78  bone]
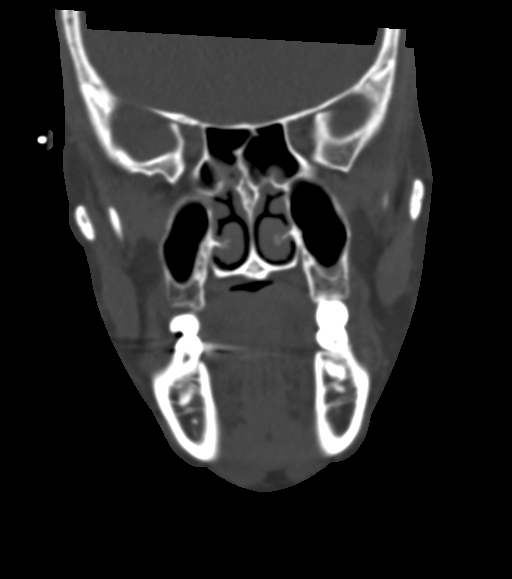

[Series 7: sagittal soft · sagittal · 0.30mm/px · 3 of 88 slices shown]
[im 30/88  bone]
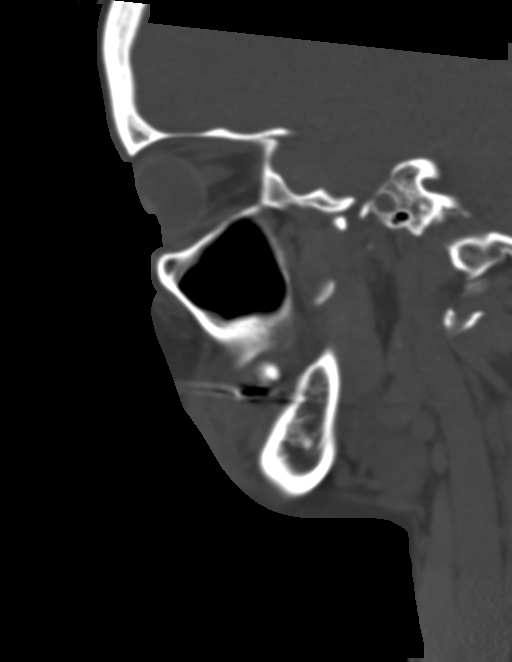
[im 44/88  bone]
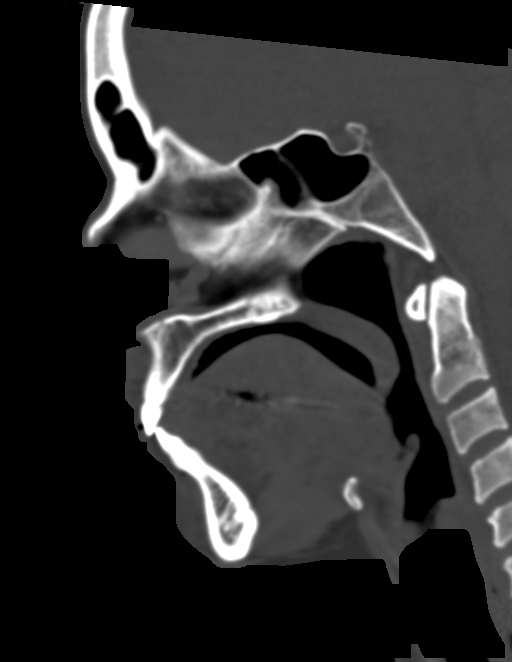
[im 59/88  bone]
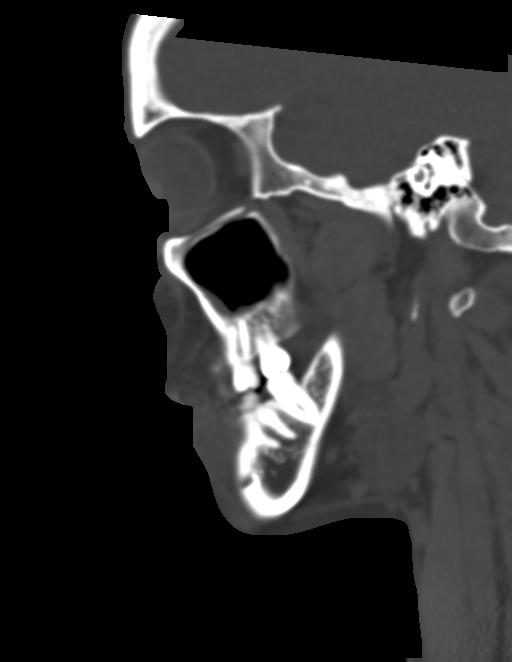

[15 of 47 positions shown; findings below may reference images not displayed]

FINDINGS: Osseous: No acute fracture of the nasal bone, zygomatic arches, or
mandibles. Temporomandibular joints are congruent. Nasal septum is
midline.

Orbits: No acute orbital fracture.  No globe injury.

Sinuses: Mucosal thickening involving the right greater than left
sphenoid sinus, ethmoid air cells, and both maxillary sinuses. No
sinus fluid level or hemosinus. No sinus fracture. No mastoid
effusion.

Soft tissues: No confluent soft tissue hematoma. Minimal
subcutaneous edema in the infraorbital regions.

Limited intracranial: No significant or unexpected finding.
IMPRESSION: 1. No acute facial bone fracture.
2. Paranasal sinus mucosal thickening.
